# Patient Record
Sex: Female | Born: 1965
Health system: Southern US, Community
[De-identification: ages and names within clinical notes are randomized; demographics above are authoritative.]

## PROBLEM LIST (undated history)

## (undated) DIAGNOSIS — G43909 Migraine, unspecified, not intractable, without status migrainosus: Secondary | ICD-10-CM

## (undated) DIAGNOSIS — I10 Essential (primary) hypertension: Secondary | ICD-10-CM

## (undated) DIAGNOSIS — B399 Histoplasmosis, unspecified: Secondary | ICD-10-CM

## (undated) DIAGNOSIS — K589 Irritable bowel syndrome without diarrhea: Secondary | ICD-10-CM

## (undated) DIAGNOSIS — I319 Disease of pericardium, unspecified: Secondary | ICD-10-CM

## (undated) DIAGNOSIS — E7800 Pure hypercholesterolemia, unspecified: Secondary | ICD-10-CM

## (undated) HISTORY — PX: CHOLECYSTECTOMY: SHX55

## (undated) HISTORY — DX: Pure hypercholesterolemia, unspecified: E78.00

## (undated) HISTORY — DX: Irritable bowel syndrome, unspecified: K58.9

## (undated) HISTORY — PX: WRIST SURGERY: SHX841

## (undated) HISTORY — PX: THORACOTOMY: SUR1349

---

## 1996-04-07 HISTORY — PX: THORACOTOMY: SUR1349

## 2015-09-21 ENCOUNTER — Emergency Department: Payer: BLUE CROSS/BLUE SHIELD

## 2015-09-21 ENCOUNTER — Encounter: Payer: Self-pay | Admitting: Emergency Medicine

## 2015-09-21 ENCOUNTER — Emergency Department
Admission: EM | Admit: 2015-09-21 | Discharge: 2015-09-21 | Disposition: A | Discharge status: Home / Self Care | Payer: BLUE CROSS/BLUE SHIELD | Attending: Emergency Medicine | Admitting: Emergency Medicine

## 2015-09-21 DIAGNOSIS — G542 Cervical root disorders, not elsewhere classified: Secondary | ICD-10-CM | POA: Diagnosis not present

## 2015-09-21 DIAGNOSIS — R51 Headache: Secondary | ICD-10-CM | POA: Diagnosis present

## 2015-09-21 HISTORY — DX: Histoplasmosis, unspecified: B39.9

## 2015-09-21 HISTORY — DX: Disease of pericardium, unspecified: I31.9

## 2015-09-21 HISTORY — DX: Migraine, unspecified, not intractable, without status migrainosus: G43.909

## 2015-09-21 LAB — BASIC METABOLIC PANEL
ANION GAP: 7 (ref 5–15)
BUN: 13 mg/dL (ref 6–20)
CALCIUM: 8.8 mg/dL — AB (ref 8.9–10.3)
CHLORIDE: 106 mmol/L (ref 101–111)
CO2: 27 mmol/L (ref 22–32)
Creatinine, Ser: 0.88 mg/dL (ref 0.44–1.00)
GFR calc non Af Amer: >60 mL/min (ref >60)
GLUCOSE: 91 mg/dL (ref 65–99)
POTASSIUM: 3.9 mmol/L (ref 3.5–5.1)
Sodium: 140 mmol/L (ref 135–145)

## 2015-09-21 LAB — CBC WITH DIFFERENTIAL/PLATELET
BASOS PCT: 1 %
Basophils Absolute: 0.1 10*3/uL (ref 0–0.1)
Eosinophils Absolute: 0.2 10*3/uL (ref 0–0.7)
Eosinophils Relative: 3 %
HEMATOCRIT: 39.3 % (ref 35.0–47.0)
HEMOGLOBIN: 13.6 g/dL (ref 12.0–16.0)
LYMPHS PCT: 23 %
Lymphs Abs: 1.8 10*3/uL (ref 1.0–3.6)
MCH: 28.5 pg (ref 26.0–34.0)
MCHC: 34.5 g/dL (ref 32.0–36.0)
MCV: 82.6 fL (ref 80.0–100.0)
MONO ABS: 0.6 10*3/uL (ref 0.2–0.9)
MONOS PCT: 7 %
NEUTROS ABS: 5 10*3/uL (ref 1.4–6.5)
NEUTROS PCT: 66 %
Platelets: 316 10*3/uL (ref 150–440)
RBC: 4.76 MIL/uL (ref 3.80–5.20)
RDW: 13.2 % (ref 11.5–14.5)
WBC: 7.7 10*3/uL (ref 3.6–11.0)

## 2015-09-21 MED ORDER — HYDROCODONE-ACETAMINOPHEN 5-325 MG PO TABS
1.0000 | ORAL_TABLET | Freq: Once | ORAL | Status: AC
  Administered 2015-09-21: 1 via ORAL
  Filled 2015-09-21: qty 1

## 2015-09-21 MED ORDER — IOPAMIDOL (ISOVUE-370) INJECTION 76%
75.0000 mL | Freq: Once | INTRAVENOUS | Status: AC | PRN
  Administered 2015-09-21: 75 mL via INTRAVENOUS

## 2015-09-21 MED ORDER — HYDROCODONE-ACETAMINOPHEN 5-325 MG PO TABS: 1.0000 | ORAL_TABLET | Freq: Four times a day (QID) | ORAL | Status: DC | PRN

## 2015-09-21 NOTE — Discharge Instructions (Signed)
Cervical Radiculopathy Cervical radiculopathy happens when a nerve in the neck (cervical nerve) is pinched or bruised. This condition can develop because of an injury or as part of the normal aging process. Pressure on the cervical nerves can cause pain or numbness that runs from the neck all the way down into the arm and fingers. Usually, this condition gets better with rest. Treatment may be needed if the condition does not improve.  CAUSES This condition may be caused by:  Injury.  Slipped (herniated) disk.  Muscle tightness in the neck because of overuse.  Arthritis.  Breakdown or degeneration in the bones and joints of the spine (spondylosis) due to aging.  Bone spurs that may develop near the cervical nerves. SYMPTOMS Symptoms of this condition include:  Pain that runs from the neck to the arm and hand. The pain can be severe or irritating. It may be worse when the neck is moved.  Numbness or weakness in the affected arm and hand. DIAGNOSIS This condition may be diagnosed based on symptoms, medical history, and a physical exam. You may also have tests, including:  X-rays.  CT scan.  MRI.  Electromyogram (EMG).  Nerve conduction tests. TREATMENT In many cases, treatment is not needed for this condition. With rest, the condition usually gets better over time. If treatment is needed, options may include:  Wearing a soft neck collar for short periods of time.  Physical therapy to strengthen your neck muscles.  Medicines, such as NSAIDs, oral corticosteroids, or spinal injections.  Surgery. This may be needed if other treatments do not help. Various types of surgery may be done depending on the cause of your problems. HOME CARE INSTRUCTIONS Managing Pain  Take over-the-counter and prescription medicines only as told by your health care provider.  If directed, apply ice to the affected area.  Put ice in a plastic bag.  Place a towel between your skin and the  bag.  Leave the ice on for 20 minutes, 2-3 times per day.  If ice does not help, you can try using heat. Take a warm shower or warm bath, or use a heat pack as told by your health care provider.  Try a gentle neck and shoulder massage to help relieve symptoms. Activity  Rest as needed. Follow instructions from your health care provider about any restrictions on activities.  Do stretching and strengthening exercises as told by your health care provider or physical therapist. General Instructions  If you were given a soft collar, wear it as told by your health care provider.  Use a flat pillow when you sleep.  Keep all follow-up visits as told by your health care provider. This is important. SEEK MEDICAL CARE IF:  Your condition does not improve with treatment. SEEK IMMEDIATE MEDICAL CARE IF:  Your pain gets much worse and cannot be controlled with medicines.  You have weakness or numbness in your hand, arm, face, or leg.  You have a high fever.  You have a stiff, rigid neck.  You lose control of your bowels or your bladder (have incontinence).  You have trouble with walking, balance, or speaking.   This information is not intended to replace advice given to you by your health care provider. Make sure you discuss any questions you have with your health care provider.   Document Released: 12/17/2000 Document Revised: 12/13/2014 Document Reviewed: 05/18/2014 Elsevier Interactive Patient Education [employer].  Please return immediately if condition worsens. Please contact her primary physician or the  physician you were given for referral. If you have any specialist physicians involved in her treatment and plan please also contact them. Thank you for using [facility]. Please return especially for chest pain, shortness of breath, weakness, fever, or any other new concerns. Please take ibuprofen in over-the-counter and follow up with a primary  physician.

## 2015-09-21 NOTE — ED Notes (Addendum)
BP LEFT: 129/86   RIGHT: 118/76

## 2015-09-21 NOTE — ED Notes (Signed)
Pt reports ha for several days.  States that is not uncommon for her but yesterday started having cold feeling in left arm.  Grips equal.  Left arm slightly cooler than left. MAE, PERRL.

## 2015-09-21 NOTE — ED Provider Notes (Signed)
Time Seen: Approximately 1008  I have reviewed the triage notes  Chief Complaint: Arm Pain and Headache   History of Present Illness: Natasha Cook is a 49 y.o. female who states that she's noticed over the last several days a headache gets global in nature but focus mainly on the posterior part in the cerebellar region and along with some left-sided neck discomfort. She states she also has some pain that radiates down into her left trapezius area. She's not noticed any arm pain on the left side but she has noticed a coolness. She denies any weakness in the left arm. She denies any chest pain or shortness of breath. She denies any positional or reproducible components. She states that she's had it for approximately 2 weeks and it seemed to get worse over the weekend.   Past Medical History  Diagnosis Date  . Migraine   . Pericarditis   . Histoplasmosis     There are no active problems to display for this patient.   Past Surgical History  Procedure Laterality Date  . Cholecystectomy    . Thoracotomy    . Wrist surgery      Past Surgical History  Procedure Laterality Date  . Cholecystectomy    . Thoracotomy    . Wrist surgery      Current Outpatient Rx  Name  Route  Sig  Dispense  Refill  . ibuprofen (ADVIL,MOTRIN) 200 MG tablet   Oral   Take 800 mg by mouth every 8 (eight) hours as needed for headache, mild pain or moderate pain.         . SUMAtriptan (IMITREX) 100 MG tablet   Oral   Take 100 mg by mouth every 2 (two) hours as needed for migraine. May repeat in 2 hours if headache persists or recurs.         Marland Kitchen HYDROcodone-acetaminophen (NORCO) 5-325 MG tablet   Oral   Take 1 tablet by mouth every 6 (six) hours as needed for moderate pain.   20 tablet   0     Allergies:  Codeine and Penicillins  Family History: History reviewed. No pertinent family history.  Social History: Social History  Substance Use Topics  . Smoking status: Never Smoker   .  Smokeless tobacco: Never Used  . Alcohol Use: Yes     Review of Systems:   10 point review of systems was performed and was otherwise negative:  Constitutional: No fever Eyes: No visual disturbances ENT: No sore throat, ear pain Cardiac: No chest pain Respiratory: No shortness of breath, wheezing, or stridor Abdomen: No abdominal pain, no vomiting, No diarrhea Endocrine: No weight loss, No night sweats Extremities: No peripheral edema, cyanosis Skin: No rashes, easy bruising Neurologic: No focal weakness, trouble with speech or swollowing Urologic: No dysuria, Hematuria, or urinary frequency  Physical Exam:  ED Triage Vitals  Enc Vitals Group     BP 09/21/15 1000 122/74 mmHg     Pulse Rate 09/21/15 1000 68     Resp 09/21/15 1000 16     Temp 09/21/15 1002 98.6 F (37 C)     Temp Source 09/21/15 1002 Oral     SpO2 09/21/15 1000 100 %     Weight 09/21/15 1001 190 lb (86.183 kg)     Height 09/21/15 1001 5\' 3"  (1.6 m)     Head Cir --      Peak Flow --      Pain Score 09/21/15 0957 5  Pain Loc --      Pain Edu? --      Excl. in [place]? --     General: Awake , Alert , and Oriented times 3; GCS 15 Head: Normal cephalic , atraumatic Eyes: Pupils equal , round, reactive to light Nose/Throat: No nasal drainage, patent upper airway without erythema or exudate.  Neck: Supple, Full range of motion, No anterior adenopathy or palpable thyroid masses Lungs: Clear to ascultation without wheezes , rhonchi, or rales Heart: Regular rate, regular rhythm without murmurs , gallops , or rubs Abdomen: Soft, non tender without rebound, guarding , or rigidity; bowel sounds positive and symmetric in all 4 quadrants. No organomegaly .        Extremities: 2 plus symmetric pulses.Pulses are symmetric over the radial artery region. No edema, clubbing or cyanosis Neurologic: normal ambulation, Motor symmetric in both upper extremities with no noticeable deficits. Close testing over the radial,  medial, and all motor nerves distributions shows no deficits. Skin: warm, dry, no rashes   Labs:   All laboratory work was reviewed including any pertinent negatives or positives listed below:  [place] - Abnormal; Notable for the following:    Calcium 8.8 (*)    All other components within normal limits  CBC WITH DIFFERENTIAL/PLATELET    EKG:  ED ECG REPORT I, Daymon Larsen, the attending physician, personally viewed and interpreted this ECG.  Date: 09/21/2015 EKG Time: 1001 Rate: 71 Rhythm: normal sinus rhythm QRS Axis: normal Intervals: normal ST/T Wave abnormalities: normal Conduction Disturbances: none Narrative Interpretation: unremarkable    Radiology:      CT Angio Neck W/Cm &/Or Wo/Cm (Final result) Result time: 09/21/15 14:15:22   Final result by Rad Results In Interface (09/21/15 14:15:22)   Narrative:   CLINICAL DATA: Left neck pain. Rule out carotid dissection.  EXAM: CT ANGIOGRAPHY NECK  TECHNIQUE: Multidetector CT imaging of the neck was performed using the standard protocol during bolus administration of intravenous contrast. Multiplanar CT image reconstructions and MIPs were obtained to evaluate the vascular anatomy. Carotid stenosis measurements (when applicable) are obtained utilizing NASCET criteria, using the distal internal carotid diameter as the denominator.  CONTRAST: 75 mL Isovue 370 IV  COMPARISON: CT head 09/21/2015  FINDINGS: Aortic arch: Normal aortic arch without dissection or aneurysm. Proximal great vessels widely patent.  Right carotid system: Normal right carotid. No atherosclerotic disease or dissection or aneurysm.  Left carotid system: Normal left carotid. Negative for dissection or aneurysm. No significant stenosis.  Vertebral arteries:Both vertebral arteries are widely patent without stenosis or dissection. No aneurysm.  Skeleton: Disc degeneration and spondylosis at C4-5.  Mild spinal stenosis. Mild left foraminal narrowing due to spurring. Negative for fracture or mass lesion.  Other neck: Lung apices clear.  IMPRESSION: Negative for carotid or vertebral artery dissection. No significant arterial stenosis in the neck  Disc degeneration and spondylosis at C4-5 with mild spinal stenosis.   Electronically Signed By: Franchot Gallo M.D. On: 09/21/2015 14:15          DG Chest 2 View (Final result) Result time: 09/21/15 10:53:30   Final result by Rad Results In Interface (09/21/15 10:53:30)   Narrative:   CLINICAL DATA: Fever  EXAM: CHEST 2 VIEW  COMPARISON: None.  FINDINGS: Lungs are clear. Heart size and pulmonary vascularity are normal. No adenopathy. There is lower thoracic levoscoliosis.  IMPRESSION: No edema or consolidation.   Electronically Signed By: Lowella Grip III M.D. On: 09/21/2015 10:53  CT HEAD WO CONTRAST (Final result) Result time: 09/21/15 10:45:30   Final result by Rad Results In Interface (09/21/15 10:45:30)   Narrative:   CLINICAL DATA: Headache for 2 weeks which has worsened over the past week. History of migraines. No known injury.  EXAM: CT HEAD WITHOUT CONTRAST  TECHNIQUE: Contiguous axial images were obtained from the base of the skull through the vertex without intravenous contrast.  COMPARISON: None.  FINDINGS: The brain appears normal without hemorrhage, infarct, mass lesion, mass effect, midline shift or abnormal extra-axial fluid collection. No hydrocephalus or pneumocephalus. The calvarium is intact. Imaged paranasal sinuses and mastoid air cells are clear.  IMPRESSION: Negative head CT.   Electronically Signed By: Inge Rise M.D. On: 09/21/2015 10:45           I personally reviewed the radiologic studies     ED Course: * Patient's stay here was uneventful and the patient appears to have some form of neurologic impingement  mainly at the base at C4-C5. Patient has no neurologic deficits and was worked up primarily for carotid versus vertebral artery dissection, significant cervical disc disease, etc. The patient is otherwise stable I felt this was unlikely to be aortic dissection or coronary artery disease.    Assessment: * Cervical nerve radiculopathy   Final Clinical Impression:  Final diagnoses:  Cervical nerve root impingement     Plan: * Outpatient management Patient was prescribed Norco Patient was advised to return immediately if condition worsens. Patient was advised to follow up with their primary care physician or other specialized physicians involved in their outpatient care. The patient and/or family member/power of attorney had laboratory results reviewed at the bedside. All questions and concerns were addressed and appropriate discharge instructions were distributed by the nursing staff.            Daymon Larsen, MD 09/21/15 [phone]

## 2015-09-21 NOTE — ED Notes (Signed)
Pt states she has a hx of migraine headaches and had a migraine for 2 weeks and has worsened since the weekend. Pt states intensity is same as previous migraines but reports a different head pain. Pt usually has neck pain and eye symptoms. Pt also c/o left arm pain on Tuesday and is cold and clammy. Pt is able to move arms. Pt also states she is seeing stars a lot that started to increase in frequency this week.

## 2015-09-21 NOTE — ED Notes (Signed)
Dr. Marcelene Butte at bs

## 2015-10-23 ENCOUNTER — Ambulatory Visit (INDEPENDENT_AMBULATORY_CARE_PROVIDER_SITE_OTHER): Payer: BLUE CROSS/BLUE SHIELD | Admitting: Neurology

## 2015-10-23 ENCOUNTER — Encounter: Payer: Self-pay | Admitting: Neurology

## 2015-10-23 VITALS — BP 124/68 | HR 74 | Ht 64.0 in | Wt 191.0 lb

## 2015-10-23 DIAGNOSIS — G43709 Chronic migraine without aura, not intractable, without status migrainosus: Secondary | ICD-10-CM | POA: Diagnosis not present

## 2015-10-23 DIAGNOSIS — N943 Premenstrual tension syndrome: Secondary | ICD-10-CM

## 2015-10-23 DIAGNOSIS — G43829 Menstrual migraine, not intractable, without status migrainosus: Secondary | ICD-10-CM

## 2015-10-23 DIAGNOSIS — G478 Other sleep disorders: Secondary | ICD-10-CM

## 2015-10-23 DIAGNOSIS — M542 Cervicalgia: Secondary | ICD-10-CM | POA: Diagnosis not present

## 2015-10-23 MED ORDER — PROMETHAZINE HCL 12.5 MG PO TABS: 12.5000 mg | ORAL_TABLET | Freq: Four times a day (QID) | ORAL | Status: DC | PRN

## 2015-10-23 MED ORDER — NAPROXEN 500 MG PO TABS: ORAL_TABLET | ORAL | Status: DC

## 2015-10-23 MED ORDER — NORTRIPTYLINE HCL 25 MG PO CAPS
25.0000 mg | ORAL_CAPSULE | Freq: Every day | ORAL | Status: DC
Start: 2015-10-23 — End: 2016-01-29

## 2015-10-23 MED ORDER — SUMATRIPTAN SUCCINATE 100 MG PO TABS
ORAL_TABLET | ORAL | Status: DC
Start: 2015-10-23 — End: 2016-01-29

## 2015-10-23 NOTE — Progress Notes (Signed)
Sleep hygiene information mailed to patient.

## 2015-10-23 NOTE — Patient Instructions (Signed)
Migraine Recommendations: 1.  Start nortriptyline 25mg  at bedtime.  Call in 4 weeks with update and we can adjust dose if needed. 2.  Take sumatriptan 100mg  with naproxen 500mg  at earliest onset of headache.  May repeat dose once in 2 hours if needed.  Do not exceed two doses in 24 hours. 3.  Limit use of pain relievers to no more than 2 days out of the week.  These medications include acetaminophen, ibuprofen, triptans and narcotics.  This will help reduce risk of rebound headaches. 4.  Be aware of common food triggers such as processed sweets, processed foods with nitrites (such as deli meat, hot dogs, sausages), foods with MSG, alcohol (such as wine), chocolate, certain cheeses, certain fruits (dried fruits, some citrus fruit), vinegar, diet soda. 4.  Avoid caffeine 5.  Routine exercise 6.  Proper sleep hygiene 7.  Stay adequately hydrated with water 8.  Keep a headache diary. 9.  Maintain proper stress management. 10.  Do not skip meals. 11.  Consider supplements:  Magnesium oxide 400mg  to 600mg  daily, riboflavin 400mg , Coenzyme Q 10 100mg  three times daily 12.  Will refer you to PT for neck pain 13.  Will refer you to sleep medicine for evaluation of sleep apnea 14.  Follow up in 3 months.  Migraine Headache A migraine headache is an intense, throbbing pain on one or both sides of your head. A migraine can last for 30 minutes to several hours. CAUSES  The exact cause of a migraine headache is not always known. However, a migraine may be caused when nerves in the brain become irritated and release chemicals that cause inflammation. This causes pain. Certain things may also trigger migraines, such as:  Alcohol.  Smoking.  Stress.  Menstruation.  Aged cheeses.  Foods or drinks that contain nitrates, glutamate, aspartame, or tyramine.  Lack of sleep.  Chocolate.  Caffeine.  Hunger.  Physical exertion.  Fatigue.  Medicines used to treat chest pain (nitroglycerine),  birth control pills, estrogen, and some blood pressure medicines. SIGNS AND SYMPTOMS  Pain on one or both sides of your head.  Pulsating or throbbing pain.  Severe pain that prevents daily activities.  Pain that is aggravated by any physical activity.  Nausea, vomiting, or both.  Dizziness.  Pain with exposure to bright lights, loud noises, or activity.  General sensitivity to bright lights, loud noises, or smells. Before you get a migraine, you may get warning signs that a migraine is coming (aura). An aura may include:  Seeing flashing lights.  Seeing bright spots, halos, or zigzag lines.  Having tunnel vision or blurred vision.  Having feelings of numbness or tingling.  Having trouble talking.  Having muscle weakness. DIAGNOSIS  A migraine headache is often diagnosed based on:  Symptoms.  Physical exam.  A CT scan or MRI of your head. These imaging tests cannot diagnose migraines, but they can help rule out other causes of headaches. TREATMENT Medicines may be given for pain and nausea. Medicines can also be given to help prevent recurrent migraines.  HOME CARE INSTRUCTIONS  Only take over-the-counter or prescription medicines for pain or discomfort as directed by your health care provider. The use of long-term narcotics is not recommended.  Lie down in a dark, quiet room when you have a migraine.  Keep a journal to find out what may trigger your migraine headaches. For example, write down:  What you eat and drink.  How much sleep you get.  Any change to your  diet or medicines.  Limit alcohol consumption.  Quit smoking if you smoke.  Get 7-9 hours of sleep, or as recommended by your health care provider.  Limit stress.  Keep lights dim if bright lights bother you and make your migraines worse. SEEK IMMEDIATE MEDICAL CARE IF:   Your migraine becomes severe.  You have a fever.  You have a stiff neck.  You have vision loss.  You have muscular  weakness or loss of muscle control.  You start losing your balance or have trouble walking.  You feel faint or pass out.  You have severe symptoms that are different from your first symptoms. MAKE SURE YOU:   Understand these instructions.  Will watch your condition.  Will get help right away if you are not doing well or get worse.   This information is not intended to replace advice given to you by your health care provider. Make sure you discuss any questions you have with your health care provider.   Document Released: 03/24/2005 Document Revised: 04/14/2014 Document Reviewed: 11/29/2012 Elsevier Interactive Patient Education [employer].

## 2015-10-23 NOTE — Progress Notes (Signed)
Chart forwarded.  

## 2015-10-23 NOTE — Progress Notes (Signed)
NEUROLOGY CONSULTATION NOTE  Natasha Cook MRN: [id number] DOB: [birth year]  Referring provider: ED referral  Primary care provider: Einar Pheasant  Reason for consult:  migraine  HISTORY OF PRESENT ILLNESS: Natasha Cook is a 50 year old left-handed woman with histoplasmosis, migraine and history of pericarditis who presents for migraine.  History obtained by patient and ED note.  Onset:  childhood Location:  Usually right sided, from back of head to behind right eye (sometimes left-sided) Quality:  Throbbing headache, stabbing eye pain Intensity:  8-9/10 Aura:  Blurred vision in right eye Prodrome:  Hunger for caffeine and sugar Associated symptoms:  Nausea, vomiting, photophobia, phonophobia, osmophobia.  No autonomic symptoms. Duration:  4 hours (if sumatriptan works) up to 1 day.  It lasts 2 to 3 days around her period Frequency:  Just prior and after her period.  Otherwise, every other week (total of 7 migraine days per month, but has daily dull headache) Triggers/exacerbating factors:  Fluorescent light, red wine, scents (perfume), glare Relieving factors:  no Activity:  aggravates  Past NSAIDS:  ibuprofen Past analgesics:  Excedrin, acetaminophen/caffeine/dihydrocodone Past abortive triptans:  Sumatriptan NS (nausea) and injection (effective but inconvenient) Past muscle relaxants:  no Past anti-emetic:  no Past anti-anxiolytic:  no Past antihypertensive medications:  no Past antidepressant medications:  no Past anticonvulsant medications:  topiramate 100mg  (increased dose caused cognitive deficits and blurred vision) Past vitamins/Herbal/Supplements:  no Past antihistamines/decongestants:  no Other past medications:  no  Current NSAIDS: ibuprofen Current analgesics:  no Current triptans:  sumatriptan 100mg  Current anti-emetic:  no Current muscle relaxants:  no Current anti-anxiolytic:  no Current sleep aide:  no Current Antihypertensive medications:   no Current Antidepressant medications:  no Current Anticonvulsant medications:  no Current Vitamins/Herbal/Supplements:  no Current Antihistamines/Decongestants:  no Other therapy:  Dry needling  Caffeine:  1 cup coffee daily, 1 diet Coke every other day Alcohol:  occasional Smoker:  no Diet:  hydrates Exercise:  30 minute walk daily Depression/stress:  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  Snores.  Possibly with apneic episodes Family history of headache:  yes  She presented to the ED on 09/06/14/17 with pain and ice cold sensation of entire left arm with pain in the neck.  CBC and BMP were unremarkable.  CT of head was personally reviewed and was unremarkable.  CTA of neck was personally reviewed and was negative for carotid or vertebral artery dissection.  It also revealed disc degeneration and spondylosis at C4-5 with mild spinal stenosis.  Over the past 2 years, she has noticed problems with balance.  She works in a physical therapy class.  When she stands on one foot, she feels off balance.  If she is in the shower and closes her eyes and turns her head, she needs to hold on.  She denies dizziness but notes fullness in ears.  She denies dizziness.  PAST MEDICAL HISTORY: Past Medical History  Diagnosis Date  . Migraine   . Pericarditis   . Histoplasmosis     PAST SURGICAL HISTORY: Past Surgical History  Procedure Laterality Date  . Cholecystectomy    . Thoracotomy    . Wrist surgery      MEDICATIONS: Current Outpatient Prescriptions on File Prior to Visit  Medication Sig Dispense Refill  . HYDROcodone-acetaminophen (NORCO) 5-325 MG tablet Take 1 tablet by mouth every 6 (six) hours as needed for moderate pain. 20 tablet 0  . ibuprofen (ADVIL,MOTRIN) 200 MG tablet  Take 800 mg by mouth every 8 (eight) hours as needed for headache, mild pain or moderate pain.     No current facility-administered medications on file prior to visit.     ALLERGIES: Allergies  Allergen Reactions  . Codeine Rash  . Penicillins Rash    FAMILY HISTORY: No history of stroke, dementia.  Positive for migraines.  SOCIAL HISTORY: Social History   Social History  . Marital Status: Married    Spouse Name: N/A  . Number of Children: N/A  . Years of Education: N/A   Occupational History  . Not on file.   Social History Main Topics  . Smoking status: Never Smoker   . Smokeless tobacco: Never Used  . Alcohol Use: Yes  . Drug Use: Not on file  . Sexual Activity: Not on file   Other Topics Concern  . Not on file   Social History Narrative    REVIEW OF SYSTEMS: Constitutional: No fevers, chills, or sweats, no generalized fatigue, change in appetite Eyes: No visual changes, double vision, eye pain Ear, nose and throat: No hearing loss, ear pain, nasal congestion, sore throat Cardiovascular: No chest pain, palpitations Respiratory:  No shortness of breath at rest or with exertion, wheezes GastrointestinaI: No nausea, vomiting, diarrhea, abdominal pain, fecal incontinence Genitourinary:  No dysuria, urinary retention or frequency Musculoskeletal:  No neck pain, back pain Integumentary: No rash, pruritus, skin lesions Neurological: as above Psychiatric: No depression, insomnia, anxiety Endocrine: No palpitations, fatigue, diaphoresis, mood swings, change in appetite, change in weight, increased thirst Hematologic/Lymphatic:  No purpura, petechiae. Allergic/Immunologic: no itchy/runny eyes, nasal congestion, recent allergic reactions, rashes  PHYSICAL EXAM: Filed Vitals:   10/23/15 0940  BP: 124/68  Pulse: 74   General: No acute distress.  Patient appears well-groomed.  Head:  Normocephalic/atraumatic Eyes:  fundi examined but not visualized Neck: supple, no paraspinal tenderness, full range of motion Back: No paraspinal tenderness Heart: regular rate and rhythm Lungs: Clear to auscultation bilaterally. Vascular: No  carotid bruits. Neurological Exam: Mental status: alert and oriented to person, place, and time, recent and remote memory intact, fund of knowledge intact, attention and concentration intact, speech fluent and not dysarthric, language intact. Cranial nerves: CN I: not tested CN II: pupils equal, round and reactive to light, visual fields intact CN III, IV, VI:  full range of motion, no nystagmus, no ptosis CN V: facial sensation intact CN VII: upper and lower face symmetric CN VIII: hearing intact CN IX, X: gag intact, uvula midline CN XI: sternocleidomastoid and trapezius muscles intact CN XII: tongue midline Bulk & Tone: normal, no fasciculations. Motor:  5/5 throughout  Sensation:  Pinprick and vibration sensation intact. Deep Tendon Reflexes:  2+ throughout, toes downgoing.  Finger to nose testing:  Without dysmetria.  Heel to shin:  Without dysmetria.  Gait:  Normal station and stride.  Able to turn and tandem walk. Romberg negative.  IMPRESSION: Chronic migraine/menstrually-related migraine Neck pain with degenerative disc  Loss of balance.  No abnormalities appreciated on exam, such as neuropathy, vestibulopathy, myelopathy.  CT of head is unremarkable. Poor sleep hygiene, possible apneic episodes  PLAN: 1.  Nortriptyline 25mg  at bedtime for prevenative 2.  Sumatriptan 100mg  with naproxen 500mg  for abortive therapy.  Promethazine 12.5mg  for nausea. 3.  Refer to PT for neck pain 4.  Refer to sleep medicine for evaluation of OSA 5.  Follow up in 3 months.  Thank you for allowing me to take part in the care of this patient.  Metta Clines, DO  CC:  Einar Pheasant

## 2015-11-23 ENCOUNTER — Ambulatory Visit: Payer: BLUE CROSS/BLUE SHIELD | Admitting: Neurology

## 2015-11-27 ENCOUNTER — Ambulatory Visit (INDEPENDENT_AMBULATORY_CARE_PROVIDER_SITE_OTHER): Payer: BLUE CROSS/BLUE SHIELD | Admitting: Neurology

## 2015-11-27 ENCOUNTER — Encounter: Payer: Self-pay | Admitting: Neurology

## 2015-11-27 VITALS — BP 116/86 | HR 72 | Resp 16 | Ht 64.0 in | Wt 192.0 lb

## 2015-11-27 DIAGNOSIS — G4710 Hypersomnia, unspecified: Secondary | ICD-10-CM

## 2015-11-27 DIAGNOSIS — R0681 Apnea, not elsewhere classified: Secondary | ICD-10-CM | POA: Diagnosis not present

## 2015-11-27 DIAGNOSIS — R0683 Snoring: Secondary | ICD-10-CM | POA: Diagnosis not present

## 2015-11-27 DIAGNOSIS — R519 Headache, unspecified: Secondary | ICD-10-CM

## 2015-11-27 DIAGNOSIS — R51 Headache: Secondary | ICD-10-CM

## 2015-11-27 NOTE — Progress Notes (Signed)
Subjective:    Patient ID: Natasha Cook is a 50 y.o. female.  HPI     Star Age, MD, PhD [facility] [address].O. [place], [place] [address]  Dear Quita Skye,   I saw your patient, Natasha Cook, upon your kind request in my clinic today for initial consultation of her sleep disorder, in particular, concern for underlying obstructive sleep apnea. The patient is unaccompanied today. As you know, Natasha Cook is a 50 year old right-handed woman with an underlying medical history of histoplasmosis, pericarditis, migraine headaches and obesity, who reports snoring and excessive daytime somnolence, morning HAs and nocturia. I reviewed your office note from 10/23/2015. Her ESS is 7/24 today, fatigue score is 27/63. She had a sleep study years ago, she estimates sometime in the 2000s but results are not available for my review. She recalls that she was told she had some apneas, but no significant oxygen drops, not enough to be treated with CPAP at the time. She was told to try to lose weight. She has been struggling with her weight for quite some time. She was treated for 3-1/2 years with steroids due to her histoplasmosis and gained a lot of weight and since then has had difficulty losing weight. She does not recall any family history of OSA but suspects that her father may have had it. She reports difficulty staying asleep, morning headaches, apneic pauses while asleep. She lives with her spouse, she has no children, she works at [employer]. She is a nonsmoker, drinks alcohol occasionally, drinks caffeine in the form of coffee, 1-2 cups daily. She started nortriptyline about a month ago and she feels, it has helped her headaches and has helped her sleep.  She has two dogs in bed with them. TV is on at night in the bedroom, husband turns it off.  She denies RLS symptoms and is not known to kick her sleep. Since starting the Pamelor, she has had vivid  dreams.  She goes to bed around 10:30 to 11 PM and takes at least 30 minutes to fall asleep. Wake time is 6:15 AM and she feels fairly adequately rested. She likes to nap on WEs.  Her Past Medical History Is Significant For: Past Medical History:  Diagnosis Date  . Histoplasmosis   . Migraine   . Pericarditis     Her Past Surgical History Is Significant For: Past Surgical History:  Procedure Laterality Date  . CHOLECYSTECTOMY    . THORACOTOMY    . WRIST SURGERY      Her Family History Is Significant For: Family History  Problem Relation Age of Onset  . Skin cancer Mother   . Migraines Mother   . Tongue cancer Father   . Stroke Father   . Cancer Maternal Grandmother   . Cancer Maternal Grandfather   . Stroke Paternal Grandmother     Her Social History Is Significant For: Social History   Social History  . Marital status: Married    Spouse name: N/A  . Number of children: 0  . Years of education: PhD   Social History Main Topics  . Smoking status: Never Smoker  . Smokeless tobacco: Never Used  . Alcohol use Yes  . Drug use: No  . Sexual activity: Not Asked   Other Topics Concern  . None   Social History Narrative   Drinks 1-2 cups of coffee a day.     Her Allergies Are:  Allergies  Allergen Reactions  .  Codeine Rash  . Penicillins Rash  :   Her Current Medications Are:  Outpatient Encounter Prescriptions as of 11/27/2015  Medication Sig  . magnesium oxide (MAG-OX) 400 MG tablet Take 400 mg by mouth daily.  . Multiple Vitamin (MULTIVITAMIN) capsule Take 1 capsule by mouth daily.  . naproxen (NAPROSYN) 500 MG tablet Take 1 tablet with each dose of sumatriptan  . nortriptyline (PAMELOR) 25 MG capsule Take 1 capsule (25 mg total) by mouth at bedtime.  . SUMAtriptan (IMITREX) 100 MG tablet Take 1 tab at earliest onset of headache.  May repeat once in 2 hours if headache persists or recurs.  Marland Kitchen HYDROcodone-acetaminophen (NORCO) 5-325 MG tablet Take 1 tablet  by mouth every 6 (six) hours as needed for moderate pain. (Patient not taking: Reported on 11/27/2015)  . [DISCONTINUED] ibuprofen (ADVIL,MOTRIN) 200 MG tablet Take 800 mg by mouth every 8 (eight) hours as needed for headache, mild pain or moderate pain.  . [DISCONTINUED] promethazine (PHENERGAN) 12.5 MG tablet Take 1 tablet (12.5 mg total) by mouth every 6 (six) hours as needed for nausea or vomiting.   No facility-administered encounter medications on file as of 11/27/2015.   :  Review of Systems:  Out of a complete 14 point review of systems, all are reviewed and negative with the exception of these symptoms as listed below: Review of Systems  Constitutional: Positive for fatigue.  Neurological:       Patient states that she had sleep study in early 2000's. She was told that she had apneic events but oxygen did not drop low enough to start CPAP.  Patient has trouble falling and staying asleep, snoring, witnessed apnea, wakes up feeling tired, morning headaches, daytime tiredness, denies taking naps.    Epworth Sleepiness Scale 0= would never doze 1= slight chance of dozing 2= moderate chance of dozing 3= high chance of dozing  Sitting and reading:1 Watching TV:2 Sitting inactive in a public place (ex. Theater or meeting):0 As a passenger in a car for an hour without a break:1 Lying down to rest in the afternoon:3 Sitting and talking to someone:0 Sitting quietly after lunch (no alcohol):0 In a car, while stopped in traffic:0 Total:7  Objective:  Neurologic Exam  Physical Exam Physical Examination:   Vitals:   11/27/15 0853  BP: 116/86  Pulse: 72  Resp: 16   General Examination: The patient is a very pleasant 50 y.o. female in no acute distress. She appears well-developed and well-nourished and well groomed.   HEENT: Normocephalic, atraumatic, pupils are equal, round and reactive to light and accommodation. Funduscopic exam is normal with sharp disc margins noted.  Extraocular tracking is good without limitation to gaze excursion or nystagmus noted. Normal smooth pursuit is noted. Hearing is grossly intact. Tympanic membranes are clear bilaterally. Face is symmetric with normal facial animation and normal facial sensation. Speech is clear with no dysarthria noted. There is no hypophonia. There is no lip, neck/head, jaw or voice tremor. Neck is supple with full range of passive and active motion. There are no carotid bruits on auscultation. Oropharynx exam reveals: mild mouth dryness, adequate dental hygiene and mild airway crowding, due to smaller airway entry, slight wider uvula. Mallampati is class II. Tongue protrudes centrally and palate elevates symmetrically. Tonsils are 1+ in size. Neck size is 14 1/8 inches. She has a Mild overbite. Nasal inspection reveals no significant nasal mucosal bogginess or redness and no septal deviation.   Chest: Clear to auscultation without wheezing, rhonchi or crackles  noted.  Heart: S1+S2+0, regular and normal without murmurs, rubs or gallops noted.   Abdomen: Soft, non-tender and non-distended with normal bowel sounds appreciated on auscultation.  Extremities: There is no pitting edema in the distal lower extremities bilaterally. Pedal pulses are intact.  Skin: Warm and dry without trophic changes noted. There are no varicose veins.  Musculoskeletal: exam reveals no obvious joint deformities, tenderness or joint swelling or erythema.   Neurologically:  Mental status: The patient is awake, alert and oriented in all 4 spheres. Her immediate and remote memory, attention, language skills and fund of knowledge are appropriate. There is no evidence of aphasia, agnosia, apraxia or anomia. Speech is clear with normal prosody and enunciation. Thought process is linear. Mood is normal and affect is normal.  Cranial nerves II - XII are as described above under HEENT exam. In addition: shoulder shrug is normal with equal shoulder  height noted. Motor exam: Normal bulk, strength and tone is noted. There is no drift, tremor or rebound. Romberg is negative. Reflexes are 2+ throughout. Fine motor skills and coordination: intact with normal finger taps, normal hand movements, normal rapid alternating patting, normal foot taps and normal foot agility.  Cerebellar testing: No dysmetria or intention tremor on finger to nose testing. Heel to shin is unremarkable bilaterally. There is no truncal or gait ataxia.  Sensory exam: intact to light touch, pinprick, vibration, temperature sense in the upper and lower extremities.  Gait, station and balance: She stands easily. No veering to one side is noted. No leaning to one side is noted. Posture is age-appropriate and stance is narrow based. Gait shows normal stride length and normal pace. No problems turning are noted. Tandem walk is unremarkable. Intact toe and heel stance is noted.               Assessment and Plan:  In summary, Natasha Cook is a very pleasant 50 y.o.-year old female with an underlying medical history of histoplasmosis, pericarditis, migraine headaches and obesity, whose history and physical exam are indeed concerning for obstructive sleep apnea (OSA). I had a long chat with the patient about my findings and the diagnosis of OSA, its prognosis and treatment options. We talked about medical treatments, surgical interventions and non-pharmacological approaches. I explained in particular the risks and ramifications of untreated moderate to severe OSA, especially with respect to developing cardiovascular disease down the Road, including congestive heart failure, difficult to treat hypertension, cardiac arrhythmias, or stroke. Even type 2 diabetes has, in part, been linked to untreated OSA. Symptoms of untreated OSA include daytime sleepiness, memory problems, mood irritability and mood disorder such as depression and anxiety, lack of energy, as well as recurrent headaches,  especially morning headaches. We talked about trying to maintain a healthy lifestyle in general, as well as the importance of weight control. I encouraged the patient to eat healthy, exercise daily and keep well hydrated, to keep a scheduled bedtime and wake time routine, to not skip any meals and eat healthy snacks in between meals. I advised the patient not to drive when feeling sleepy. I recommended the following at this time: sleep study with potential positive airway pressure titration. (We will score hypopneas at 3% and split the sleep study into diagnostic and treatment portion, if the estimated. 2 hour AHI is >15/h).   I explained the sleep test procedure to the patient and also outlined possible surgical and non-surgical treatment options of OSA, including the use of a custom-made dental device (which would  require a referral to a specialist dentist or oral surgeon), upper airway surgical options, such as pillar implants, radiofrequency surgery, tongue base surgery, and UPPP (which would involve a referral to an ENT surgeon). Rarely, jaw surgery such as mandibular advancement may be considered.  I also explained the CPAP treatment option to the patient, who indicated that she would be willing to try CPAP if the need arises. I explained the importance of being compliant with PAP treatment, not only for insurance purposes but primarily to improve Her symptoms, and for the patient's long term health benefit, including to reduce Her cardiovascular risks. I answered all her questions today and the patient was in agreement. I would like to see her back after the sleep study is completed and encouraged her to call with any interim questions, concerns, problems or updates.   Thank you very much for allowing me to participate in the care of this nice patient. If I can be of any further assistance to you please do not hesitate to call me at [phone].  Sincerely,   Star Age, MD, PhD

## 2015-11-27 NOTE — Patient Instructions (Signed)

## 2015-12-28 ENCOUNTER — Ambulatory Visit (INDEPENDENT_AMBULATORY_CARE_PROVIDER_SITE_OTHER): Payer: BLUE CROSS/BLUE SHIELD | Admitting: Internal Medicine

## 2015-12-28 ENCOUNTER — Encounter: Payer: Self-pay | Admitting: Internal Medicine

## 2015-12-28 VITALS — BP 104/62 | HR 84 | Temp 98.0°F | Ht 64.0 in | Wt 194.2 lb

## 2015-12-28 DIAGNOSIS — K589 Irritable bowel syndrome without diarrhea: Secondary | ICD-10-CM | POA: Diagnosis not present

## 2015-12-28 DIAGNOSIS — G43909 Migraine, unspecified, not intractable, without status migrainosus: Secondary | ICD-10-CM | POA: Diagnosis not present

## 2015-12-28 DIAGNOSIS — Z23 Encounter for immunization: Secondary | ICD-10-CM

## 2015-12-28 DIAGNOSIS — N6019 Diffuse cystic mastopathy of unspecified breast: Secondary | ICD-10-CM

## 2015-12-28 DIAGNOSIS — N3289 Other specified disorders of bladder: Secondary | ICD-10-CM

## 2015-12-28 DIAGNOSIS — E7800 Pure hypercholesterolemia, unspecified: Secondary | ICD-10-CM | POA: Diagnosis not present

## 2015-12-28 DIAGNOSIS — Z1239 Encounter for other screening for malignant neoplasm of breast: Secondary | ICD-10-CM

## 2015-12-28 NOTE — Progress Notes (Signed)
Patient ID: Aricela Torrealba, female   DOB: 05/23/1965, 50 y.o.   MRN: [id number]   Subjective:    Patient ID: Caren Macadam, female    DOB: 1965/04/12, 50 y.o.   MRN: [id number]  HPI  Patient here to establish care.  She has a history of histoplasmosis, migraines and pericarditis.  Saw Dr Tomi Likens for her headaches.  Started on nortriptyline.  Off topamax.  Headaches are much better.  This is her third year in [place].  She works as head of the physical therapy department.  Is behind on her physical exams (including mammogram and pap smears).  Had fibrocystic breast disease.  She also has IBS.  Had increased diarrhea after gallbladder out.  Was placed on wellchol.  Diarrhea more controlled.  Off wellchol now and diarrhea remains improved.  She also reports a spastic bladder.  Was previously on ditropan.  Had dizziness with this medication.  Off now.  Still having issues with her bladder.  Discussed pelvic floor exercises.  She is in agreement.  She also has had issues with neck and left shoulder pain (from neck down her arm).  Went to ER.  Had CTA as outlined.  Referred to neurology.  Neck and arm pain improved.   She has had issues with sleeping.  Saw neurology.  Planning for sleep study 01/27/16.     Past Medical History:  Diagnosis Date  . Histoplasmosis   . Hypercholesterolemia   . IBS (irritable bowel syndrome)   . Migraine   . Pericarditis    Past Surgical History:  Procedure Laterality Date  . CHOLECYSTECTOMY    . THORACOTOMY    . THORACOTOMY  1998  . WRIST SURGERY     Family History  Problem Relation Age of Onset  . Skin cancer Mother   . Migraines Mother   . Tongue cancer Father   . Stroke Father   . Cancer Maternal Grandmother   . Cancer Maternal Grandfather   . Stroke Paternal Grandmother    Social History   Social History  . Marital status: Married    Spouse name: N/A  . Number of children: 0  . Years of education: PhD   Social History Main Topics  . Smoking status:  Never Smoker  . Smokeless tobacco: Never Used  . Alcohol use Yes  . Drug use: No  . Sexual activity: Not Asked   Other Topics Concern  . None   Social History Narrative   Drinks 1-2 cups of coffee a day.     Outpatient Encounter Prescriptions as of 12/28/2015  Medication Sig  . magnesium oxide (MAG-OX) 400 MG tablet Take 400 mg by mouth daily.  . Multiple Vitamin (MULTIVITAMIN) capsule Take 1 capsule by mouth daily.  . naproxen (NAPROSYN) 500 MG tablet Take 1 tablet with each dose of sumatriptan  . nortriptyline (PAMELOR) 25 MG capsule Take 1 capsule (25 mg total) by mouth at bedtime.  . SUMAtriptan (IMITREX) 100 MG tablet Take 1 tab at earliest onset of headache.  May repeat once in 2 hours if headache persists or recurs.  . [DISCONTINUED] HYDROcodone-acetaminophen (NORCO) 5-325 MG tablet Take 1 tablet by mouth every 6 (six) hours as needed for moderate pain.   No facility-administered encounter medications on file as of 12/28/2015.     Review of Systems  Constitutional: Negative for appetite change and unexpected weight change.  HENT: Negative for congestion and sinus pressure.   Respiratory: Negative for cough, chest tightness and shortness of breath.  Cardiovascular: Negative for chest pain, palpitations and leg swelling.  Gastrointestinal: Negative for abdominal pain, diarrhea, nausea and vomiting.       Diarrhea improved.   Genitourinary: Negative for difficulty urinating and dysuria.  Musculoskeletal: Negative for joint swelling and myalgias.  Skin: Negative for color change and rash.  Neurological: Positive for headaches. Negative for dizziness and light-headedness.  Psychiatric/Behavioral: Negative for agitation and dysphoric mood.       Objective:    Physical Exam  Constitutional: She appears well-developed and well-nourished. No distress.  HENT:  Nose: Nose normal.  Mouth/Throat: Oropharynx is clear and moist.  Neck: Neck supple. No thyromegaly present.    Cardiovascular: Normal rate and regular rhythm.   Pulmonary/Chest: Breath sounds normal. No respiratory distress. She has no wheezes.  Abdominal: Soft. Bowel sounds are normal. There is no tenderness.  Musculoskeletal: She exhibits no edema or tenderness.  Lymphadenopathy:    She has no cervical adenopathy.  Skin: No rash noted. No erythema.  Psychiatric: She has a normal mood and affect. Her behavior is normal.    BP 104/62   Pulse 84   Temp 98 F (36.7 C) (Oral)   Ht 5\' 4"  (1.626 m)   Wt 194 lb 3.2 oz (88.1 kg)   LMP 12/10/2015   SpO2 96%   BMI 33.33 kg/m  Wt Readings from Last 3 Encounters:  12/28/15 194 lb 3.2 oz (88.1 kg)  11/27/15 192 lb (87.1 kg)  10/23/15 191 lb (86.6 kg)     Lab Results  Component Value Date   WBC 7.7 09/21/2015   HGB 13.6 09/21/2015   HCT 39.3 09/21/2015   PLT 316 09/21/2015   GLUCOSE 91 09/21/2015   NA 140 09/21/2015   K 3.9 09/21/2015   CL 106 09/21/2015   CREATININE 0.88 09/21/2015   BUN 13 09/21/2015   CO2 27 09/21/2015    Dg Chest 2 View  Result Date: 09/21/2015 CLINICAL DATA:  Fever EXAM: CHEST  2 VIEW COMPARISON:  None. FINDINGS: Lungs are clear. Heart size and pulmonary vascularity are normal. No adenopathy. There is lower thoracic levoscoliosis. IMPRESSION: No edema or consolidation. Electronically Signed   By: Lowella Grip III M.D.   On: 09/21/2015 10:53   Ct Head Wo Contrast  Result Date: 09/21/2015 CLINICAL DATA:  Headache for 2 weeks which has worsened over the past week. History of migraines. No known injury. EXAM: CT HEAD WITHOUT CONTRAST TECHNIQUE: Contiguous axial images were obtained from the base of the skull through the vertex without intravenous contrast. COMPARISON:  None. FINDINGS: The brain appears normal without hemorrhage, infarct, mass lesion, mass effect, midline shift or abnormal extra-axial fluid collection. No hydrocephalus or pneumocephalus. The calvarium is intact. Imaged paranasal sinuses and mastoid  air cells are clear. IMPRESSION: Negative head CT. Electronically Signed   By: Inge Rise M.D.   On: 09/21/2015 10:45   Ct Angio Neck W/cm &/or Wo/cm  Result Date: 09/21/2015 CLINICAL DATA:  Left neck pain.  Rule out carotid dissection. EXAM: CT ANGIOGRAPHY NECK TECHNIQUE: Multidetector CT imaging of the neck was performed using the standard protocol during bolus administration of intravenous contrast. Multiplanar CT image reconstructions and MIPs were obtained to evaluate the vascular anatomy. Carotid stenosis measurements (when applicable) are obtained utilizing NASCET criteria, using the distal internal carotid diameter as the denominator. CONTRAST:  75 mL Isovue 370 IV COMPARISON:  CT head 09/21/2015 FINDINGS: Aortic arch: Normal aortic arch without dissection or aneurysm. Proximal great vessels widely patent. Right carotid system:  Normal right carotid. No atherosclerotic disease or dissection or aneurysm. Left carotid system: Normal left carotid. Negative for dissection or aneurysm. No significant stenosis. Vertebral arteries:Both vertebral arteries are widely patent without stenosis or dissection. No aneurysm. Skeleton: Disc degeneration and spondylosis at C4-5. Mild spinal stenosis. Mild left foraminal narrowing due to spurring. Negative for fracture or mass lesion. Other neck: Lung apices clear. IMPRESSION: Negative for carotid or vertebral artery dissection. No significant arterial stenosis in the neck Disc degeneration and spondylosis at C4-5 with mild spinal stenosis. Electronically Signed   By: Franchot Gallo M.D.   On: 09/21/2015 14:15       Assessment & Plan:   Problem List Items Addressed This Visit    Fibrocystic disease of breast    Schedule mammogram - overdue.        Hypercholesterolemia    Low cholesterol diet and exercise.  Follow lipid panel.        Relevant Orders   CBC with Differential/Platelet   Comprehensive metabolic panel   TSH   Lipid panel   IBS (irritable  bowel syndrome)    Bowels better.  Discussed need for colonoscopy.        Migraine - Primary    Seeing neurology now.  He has adjusted her medications.  Doing well on current regimen.  Follow.        Spastic bladder    Reports having a history of spastic bladder and weakness.   Has tried ditropan.  Did not tolerate.  Will refer to therapy for pelvic floor exercises.        Relevant Orders   Ambulatory referral to Physical Therapy    Other Visit Diagnoses    Encounter for immunization       Relevant Orders   Flu Vaccine QUAD 36+ mos IM (Completed)   MM DIGITAL SCREENING BILATERAL   Ambulatory referral to Physical Therapy   Breast cancer screening       Relevant Orders   MM DIGITAL SCREENING BILATERAL       Einar Pheasant, MD

## 2015-12-28 NOTE — Progress Notes (Signed)
Pre visit review using our clinic review tool, if applicable. No additional management support is needed unless otherwise documented below in the visit note. 

## 2015-12-30 ENCOUNTER — Encounter: Payer: Self-pay | Admitting: Internal Medicine

## 2015-12-30 DIAGNOSIS — G43909 Migraine, unspecified, not intractable, without status migrainosus: Secondary | ICD-10-CM | POA: Insufficient documentation

## 2015-12-30 DIAGNOSIS — N6019 Diffuse cystic mastopathy of unspecified breast: Secondary | ICD-10-CM | POA: Insufficient documentation

## 2015-12-30 DIAGNOSIS — K589 Irritable bowel syndrome without diarrhea: Secondary | ICD-10-CM | POA: Insufficient documentation

## 2015-12-30 DIAGNOSIS — N3289 Other specified disorders of bladder: Secondary | ICD-10-CM | POA: Insufficient documentation

## 2015-12-30 DIAGNOSIS — E7800 Pure hypercholesterolemia, unspecified: Secondary | ICD-10-CM | POA: Insufficient documentation

## 2015-12-30 NOTE — Assessment & Plan Note (Signed)
Schedule mammogram - overdue.

## 2015-12-30 NOTE — Assessment & Plan Note (Signed)
Bowels better.  Discussed need for colonoscopy.

## 2015-12-30 NOTE — Assessment & Plan Note (Signed)
Reports having a history of spastic bladder and weakness.   Has tried ditropan.  Did not tolerate.  Will refer to therapy for pelvic floor exercises.

## 2015-12-30 NOTE — Assessment & Plan Note (Signed)
Seeing neurology now.  He has adjusted her medications.  Doing well on current regimen.  Follow.

## 2015-12-30 NOTE — Assessment & Plan Note (Signed)
Low cholesterol diet and exercise.  Follow lipid panel.   

## 2016-01-27 ENCOUNTER — Ambulatory Visit (INDEPENDENT_AMBULATORY_CARE_PROVIDER_SITE_OTHER): Payer: BLUE CROSS/BLUE SHIELD | Admitting: Neurology

## 2016-01-27 DIAGNOSIS — G4710 Hypersomnia, unspecified: Secondary | ICD-10-CM

## 2016-01-27 DIAGNOSIS — G4720 Circadian rhythm sleep disorder, unspecified type: Secondary | ICD-10-CM

## 2016-01-29 ENCOUNTER — Ambulatory Visit (INDEPENDENT_AMBULATORY_CARE_PROVIDER_SITE_OTHER): Payer: BLUE CROSS/BLUE SHIELD | Admitting: Neurology

## 2016-01-29 ENCOUNTER — Encounter: Payer: Self-pay | Admitting: Neurology

## 2016-01-29 VITALS — BP 122/80 | HR 96 | Ht 64.0 in | Wt 193.3 lb

## 2016-01-29 DIAGNOSIS — G43829 Menstrual migraine, not intractable, without status migrainosus: Secondary | ICD-10-CM | POA: Diagnosis not present

## 2016-01-29 MED ORDER — NORTRIPTYLINE HCL 50 MG PO CAPS: 50.0000 mg | ORAL_CAPSULE | Freq: Every day | ORAL | 3 refills | Status: DC

## 2016-01-29 MED ORDER — SUMATRIPTAN SUCCINATE 100 MG PO TABS
ORAL_TABLET | ORAL | 2 refills | Status: DC
Start: 2016-01-29 — End: 2016-05-09

## 2016-01-29 MED ORDER — NAPROXEN 500 MG PO TABS: ORAL_TABLET | ORAL | 2 refills | Status: DC

## 2016-01-29 NOTE — Progress Notes (Signed)
NEUROLOGY FOLLOW UP OFFICE NOTE  Natasha Cook [id number]  HISTORY OF PRESENT ILLNESS: Natasha Cook is a 50 year old left-handed woman with histoplasmosis, migraine and history of pericarditis who follows up for migraine.  UPDATE: Neck pain improved with dry needling She was referred to sleep medicine for evaluation of OSA.  She had a sleep study over the weekend and is still waiting for results. She no longer has the daily headaches but migraines still occur about 6 or 7 days a month.  Intensity is less as well.  Intensity:  No more than 8/10 Duration:  2 hours Frequency:  6 to 7 days per month. Current NSAIDS: naproxen 500mg  (with sumatriptan) Current analgesics:  no Current triptans:  sumatriptan 100mg  (with naproxen 500mg ) Current anti-emetic:  no Current muscle relaxants:  no Current anti-anxiolytic:  no Current sleep aide:  no Current Antihypertensive medications:  no Current Antidepressant medications:  nortriptyline 25mg  Current Anticonvulsant medications:  no Current Vitamins/Herbal/Supplements:  no Current Antihistamines/Decongestants:  no Other therapy:  Dry needling   Caffeine:  1 cup coffee daily, 1 diet Coke every other day Alcohol:  occasional Smoker:  no Diet:  hydrates Exercise:  30 minute walk daily Depression/stress:  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  Snores.  Possibly with apneic episodes  HISTORY:  Onset:  childhood Location:  Usually right sided, from back of head to behind right eye (sometimes left-sided) Quality:  Throbbing headache, stabbing eye pain Initial Intensity:  8-9/10 Aura:  Blurred vision in right eye Prodrome:  Hunger for caffeine and sugar Associated symptoms:  Nausea, vomiting, photophobia, phonophobia, osmophobia.  No autonomic symptoms. Initial Duration:  4 hours (if sumatriptan works) up to 1 day.  It lasts 2 to 3 days around her period Initial Frequency:  Just prior and after her  period.  Otherwise, every other week (total of 7 migraine days per month, but has daily dull headache) Triggers/exacerbating factors:  Fluorescent light, red wine, scents (perfume), glare Relieving factors:  no Activity:  aggravates   Past NSAIDS:  ibuprofen Past analgesics:  Excedrin, acetaminophen/caffeine/dihydrocodone Past abortive triptans:  Sumatriptan NS (nausea) and injection (effective but inconvenient) Past muscle relaxants:  no Past anti-emetic:  no Past anti-anxiolytic:  no Past antihypertensive medications:  no Past antidepressant medications:  no Past anticonvulsant medications:  topiramate 100mg  (increased dose caused cognitive deficits and blurred vision) Past vitamins/Herbal/Supplements:  no Past antihistamines/decongestants:  no Other past medications:  no   Family history of headache:  yes   She presented to the ED on 09/06/14/17 with pain and ice cold sensation of entire left arm with pain in the neck.  CBC and BMP were unremarkable.  CT of head was personally reviewed and was unremarkable.  CTA of neck was personally reviewed and was negative for carotid or vertebral artery dissection.  It also revealed disc degeneration and spondylosis at C4-5 with mild spinal stenosis.   Over the past 2 years, she has noticed problems with balance.  She works in a physical therapy class.  When she stands on one foot, she feels off balance.  If she is in the shower and closes her eyes and turns her head, she needs to hold on.  She denies dizziness but notes fullness in ears.  She denies dizziness.  PAST MEDICAL HISTORY: Past Medical History:  Diagnosis Date  . Histoplasmosis   . Hypercholesterolemia   . IBS (irritable bowel syndrome)   . Migraine   .  Pericarditis     MEDICATIONS: Current Outpatient Prescriptions on File Prior to Visit  Medication Sig Dispense Refill  . Multiple Vitamin (MULTIVITAMIN) capsule Take 1 capsule by mouth daily.     No current facility-administered  medications on file prior to visit.     ALLERGIES: Allergies  Allergen Reactions  . Codeine Rash  . Penicillins Rash    FAMILY HISTORY: Family History  Problem Relation Age of Onset  . Skin cancer Mother   . Migraines Mother   . Tongue cancer Father   . Stroke Father   . Cancer Maternal Grandmother   . Cancer Maternal Grandfather   . Stroke Paternal Grandmother     SOCIAL HISTORY: Social History   Social History  . Marital status: Married    Spouse name: N/A  . Number of children: 0  . Years of education: PhD   Occupational History  . Not on file.   Social History Main Topics  . Smoking status: Never Smoker  . Smokeless tobacco: Never Used  . Alcohol use Yes  . Drug use: No  . Sexual activity: Not on file   Other Topics Concern  . Not on file   Social History Narrative   Drinks 1-2 cups of coffee a day.     REVIEW OF SYSTEMS: Constitutional: No fevers, chills, or sweats, no generalized fatigue, change in appetite Eyes: No visual changes, double vision, eye pain Ear, nose and throat: No hearing loss, ear pain, nasal congestion, sore throat Cardiovascular: No chest pain, palpitations Respiratory:  No shortness of breath at rest or with exertion, wheezes GastrointestinaI: No nausea, vomiting, diarrhea, abdominal pain, fecal incontinence Genitourinary:  No dysuria, urinary retention or frequency Musculoskeletal:  No neck pain, back pain Integumentary: No rash, pruritus, skin lesions Neurological: as above Psychiatric: No depression, insomnia, anxiety Endocrine: No palpitations, fatigue, diaphoresis, mood swings, change in appetite, change in weight, increased thirst Hematologic/Lymphatic:  No purpura, petechiae. Allergic/Immunologic: no itchy/runny eyes, nasal congestion, recent allergic reactions, rashes  PHYSICAL EXAM: Vitals:   01/29/16 0841  BP: 122/80  Pulse: 96   General: No acute distress.  Patient appears well-groomed.  Head:   Normocephalic/atraumatic Eyes:  Fundi examined but not visualized Neck: supple, no paraspinal tenderness, full range of motion Heart:  Regular rate and rhythm Lungs:  Clear to auscultation bilaterally Back: No paraspinal tenderness Neurological Exam: alert and oriented to person, place, and time. Attention span and concentration intact, recent and remote memory intact, fund of knowledge intact.  Speech fluent and not dysarthric, language intact.  CN II-XII intact. Bulk and tone normal, muscle strength 5/5 throughout.  Sensation to light touch  intact.  Deep tendon reflexes 2+ throughout.  Finger to nose testing intact.  Gait normal  IMPRESSION: Migraine  PLAN: 1.  No longer with daily headache but will increase nortriptyline to 50mg  at bedtime in order to try and reduce frequency. 2.  Sumatriptan and naproxen for abortive therapy 3.  Follow up in 3 months.  15 minutes spent face to face with patient, over 50% spent counseling.  Metta Clines, DO  CC:  Einar Pheasant, MD

## 2016-01-30 NOTE — Progress Notes (Signed)
Chart forwarded.  

## 2016-01-31 ENCOUNTER — Other Ambulatory Visit (INDEPENDENT_AMBULATORY_CARE_PROVIDER_SITE_OTHER): Payer: BLUE CROSS/BLUE SHIELD

## 2016-01-31 DIAGNOSIS — E7800 Pure hypercholesterolemia, unspecified: Secondary | ICD-10-CM | POA: Diagnosis not present

## 2016-01-31 LAB — COMPREHENSIVE METABOLIC PANEL
ALBUMIN: 4.2 g/dL (ref 3.5–5.2)
ALK PHOS: 58 U/L (ref 39–117)
ALT: 20 U/L (ref 0–35)
AST: 9 U/L (ref 0–37)
BUN: 13 mg/dL (ref 6–23)
CALCIUM: 9.5 mg/dL (ref 8.4–10.5)
CO2: 29 mEq/L (ref 19–32)
Chloride: 101 mEq/L (ref 96–112)
Creatinine, Ser: 0.91 mg/dL (ref 0.40–1.20)
GFR: 69.26 mL/min (ref >60.00)
Glucose, Bld: 101 mg/dL — ABNORMAL HIGH (ref 70–99)
POTASSIUM: 3.9 meq/L (ref 3.5–5.1)
Sodium: 138 mEq/L (ref 135–145)
TOTAL PROTEIN: 7.3 g/dL (ref 6.0–8.3)
Total Bilirubin: 0.8 mg/dL (ref 0.2–1.2)

## 2016-01-31 LAB — LDL CHOLESTEROL, DIRECT: LDL DIRECT: 129 mg/dL

## 2016-01-31 LAB — LIPID PANEL
Cholesterol: 234 mg/dL — ABNORMAL HIGH (ref 0–200)
HDL: 42.2 mg/dL (ref >39.00)
NonHDL: 191.5
Total CHOL/HDL Ratio: 6
Triglycerides: 390 mg/dL — ABNORMAL HIGH (ref 0.0–149.0)
VLDL: 78 mg/dL — ABNORMAL HIGH (ref 0.0–40.0)

## 2016-01-31 LAB — CBC WITH DIFFERENTIAL/PLATELET
Basophils Absolute: 0 10*3/uL (ref 0.0–0.1)
Basophils Relative: 0.2 % (ref 0.0–3.0)
EOS PCT: 2.2 % (ref 0.0–5.0)
Eosinophils Absolute: 0.3 10*3/uL (ref 0.0–0.7)
HEMATOCRIT: 42.7 % (ref 36.0–46.0)
HEMOGLOBIN: 14.5 g/dL (ref 12.0–15.0)
Lymphocytes Relative: 15.7 % (ref 12.0–46.0)
Lymphs Abs: 2 10*3/uL (ref 0.7–4.0)
MCHC: 34 g/dL (ref 30.0–36.0)
MCV: 84.2 fl (ref 78.0–100.0)
MONOS PCT: 6.2 % (ref 3.0–12.0)
Monocytes Absolute: 0.8 10*3/uL (ref 0.1–1.0)
Neutro Abs: 9.7 10*3/uL — ABNORMAL HIGH (ref 1.4–7.7)
Neutrophils Relative %: 75.7 % (ref 43.0–77.0)
Platelets: 439 10*3/uL — ABNORMAL HIGH (ref 150.0–400.0)
RBC: 5.07 Mil/uL (ref 3.87–5.11)
RDW: 13.5 % (ref 11.5–15.5)
WBC: 12.8 10*3/uL — AB (ref 4.0–10.5)

## 2016-01-31 LAB — TSH: TSH: 2.67 u[IU]/mL (ref 0.35–4.50)

## 2016-02-01 ENCOUNTER — Other Ambulatory Visit: Payer: Self-pay | Admitting: Internal Medicine

## 2016-02-01 DIAGNOSIS — D75839 Thrombocytosis, unspecified: Secondary | ICD-10-CM

## 2016-02-01 DIAGNOSIS — D473 Essential (hemorrhagic) thrombocythemia: Secondary | ICD-10-CM

## 2016-02-01 NOTE — Progress Notes (Signed)
Order placed for f/u lab  Dr Nicki Reaper

## 2016-02-04 ENCOUNTER — Telehealth: Payer: Self-pay | Admitting: Neurology

## 2016-02-04 NOTE — Telephone Encounter (Signed)
I spoke to patient and she is aware of results and recommendations. I will mail her a sleep hygiene information sheet. I have also sent the report to Dr. Nicki Reaper and Dr. Tomi Likens.

## 2016-02-04 NOTE — Telephone Encounter (Signed)
Patient referred by Dr. Tomi Likens, seen by me on 11/27/15, diagnostic PSG on 01/27/16.   Please call and notify the patient that the recent sleep study did not show any significant obstructive sleep apnea or intrinsic sleep d/o. Please inform patient that I can see her in sleep clinic as needed, follow up with Drs. Tomi Likens and Scott (PCP) as scheduled.  Please remind patient to try to maintain good sleep hygiene, which means: Keep a regular sleep and wake schedule, try not to exercise or have a meal within 2 hours of your bedtime, try to keep your bedroom conducive for sleep, that is, cool and dark, without light distractors such as an illuminated alarm clock, and refrain from watching TV right before sleep or in the middle of the night and do not keep the TV or radio on during the night. Also, try not to use or play on electronic devices at bedtime, such as your cell phone, tablet PC or laptop. If you like to read at bedtime on an electronic device, try to dim the background light as much as possible. Do not eat in the middle of the night.   Also, route or fax report to PCP and referring MD, if other than PCP.  Once you have spoken to patient, you can close this encounter.   Thanks,  Star Age, MD, PhD [facility])

## 2016-02-04 NOTE — Progress Notes (Signed)
PATIENT'S NAME:  Natasha Cook, Natasha Cook DOB:      1965-04-25      MR#:    [id number]     DATE OF RECORDING: 01/27/2016 REFERRING M.D.: Metta Clines, MD, PCP: Einar Pheasant, MD Study Performed:   Baseline Polysomnogram HISTORY:  50 year old woman with a history of histoplasmosis, pericarditis, migraine headaches and obesity, who reports snoring and excessive daytime somnolence, morning HAs and nocturia. The female patient endorsed the Epworth Sleepiness Scale at 7 points and the Fatigue Score at 27 points.   The patient's weight 192 pounds with a height of 64 (inches), resulting in a BMI of 32.7 kg/m2. The patient's neck circumference measured 14 inches.  CURRENT MEDICATIONS: Mag-Ox, Multivitamin, Naprosyn, Pamelor, Imitrex, Norco   PROCEDURE:  This is a multichannel digital polysomnogram utilizing the Somnostar 11.2 system.  Electrodes and sensors were applied and monitored per AASM Specifications.   EEG, EOG, Chin and Limb EMG, were sampled at 200 Hz.  ECG, Snore and Nasal Pressure, Thermal Airflow, Respiratory Effort, CPAP Flow and Pressure, Oximetry was sampled at 50 Hz. Digital video and audio were recorded.      BASELINE STUDY  Lights Out was at 22:12 and Lights On at 05:09.  Total recording time (TRT) was 417.5 minutes, with a total sleep time (TST) of  384 minutes.   The patient's sleep latency was 18 minutes.  REM latency was 76 minutes, which is normal. The sleep efficiency was 92. %, which is normal.     SLEEP ARCHITECTURE: WASO (Wake after sleep onset) was 24.5 minutes with minimal sleep fragmentation noted.  There were 5 minutes in Stage N1, 229 minutes Stage N2, 115.5 minutes Stage N3 and 34.5 minutes in Stage REM.  The percentage of Stage N1 was 1.3%, Stage N2 was 59.6%, which is mildly increased, Stage N3 was 30.1%, which is increased and Stage R (REM sleep) was 9.%, which is reduced.  The video and audio analysis did not show any abnormal or unusual behaviors, movements, phonations or  vocalizations.  RESPIRATORY ANALYSIS:  There were a total of 26 respiratory events:  8 obstructive apneas, 0 central apneas and 4 mixed apneas with a total of 12 apneas and an apnea index (AI) of 1.9 /hour. There were 14 hypopneas with a hypopnea index of 2.2 /hour. The patient also had 0 respiratory event related arousals (RERAs).      The total APNEA/HYPOPNEA INDEX (AHI) was 4.1/hour and the total RESPIRATORY DISTURBANCE INDEX was 4.1 /hour.  17 events occurred in REM sleep and 10 events in NREM. The REM AHI was 29.6 /hour, versus a non-REM AHI of 1.5. The patient spent 167 minutes of total sleep time in the supine position and 217 minutes in non-supine.. The supine AHI was 8.7 versus a non-supine AHI of 0.6.  OXYGEN SATURATION & C02:  The Wake baseline 02 saturation was 98%, with the lowest being 87%. Time spent below 89% saturation equaled 0 minutes.  Mild intermittent snoring was noted.  The patient took one bathroom break.  EKG was in keeping with NSR.   PERIODIC LIMB MOVEMENTS:   The patient had a total of 0 Periodic Limb Movements.  The Periodic Limb Movement (PLM) index was 0 and the PLM Arousal index was 0 /hour.   IMPRESSION:  1.  Dysfunctions associated with sleep stages or arousal from sleep   RECOMMENDATIONS:  1. This study does not demonstrate any significant obstructive or central sleep disordered breathing. This study does not support an intrinsic sleep disorder as  a cause of the patient's symptoms. Other causes, including circadian rhythm disturbances, an underlying mood disorder, medication effect and/or an underlying medical problem cannot be ruled out. 2. The patient should be cautioned not to drive, work at heights, or operate dangerous or heavy equipment when tired or sleepy. Review and reiteration of good sleep hygiene measures should be pursued with any patient. 3. This study shows sleep fragmentation and abnormal sleep stage percentages; these are nonspecific  findings and per se do not signify an intrinsic sleep disorder or a cause for the patient's sleep-related symptoms. Causes include (but are not limited to) the first night effect of the sleep study, circadian rhythm disturbances, medication effect or an underlying mood disorder or medical problem.  4. The patient can follow-up with her referring provider, who will be notified of the test results.    I certify that I have reviewed the entire raw data recording prior to the issuance of this report in accordance with the Standards of Accreditation of the [place] [employer] (AASM)       Star Age, MD,PhD Diplomat, American Board of Psychiatry and Neurology  Diplomat, [place] of [employer] Medicine

## 2016-02-19 ENCOUNTER — Other Ambulatory Visit (INDEPENDENT_AMBULATORY_CARE_PROVIDER_SITE_OTHER): Payer: BLUE CROSS/BLUE SHIELD

## 2016-02-19 DIAGNOSIS — D473 Essential (hemorrhagic) thrombocythemia: Secondary | ICD-10-CM

## 2016-02-19 DIAGNOSIS — D75839 Thrombocytosis, unspecified: Secondary | ICD-10-CM

## 2016-02-19 LAB — CBC WITH DIFFERENTIAL/PLATELET
BASOS ABS: 0.1 10*3/uL (ref 0.0–0.1)
Basophils Relative: 0.8 % (ref 0.0–3.0)
EOS ABS: 0.3 10*3/uL (ref 0.0–0.7)
Eosinophils Relative: 4.1 % (ref 0.0–5.0)
HEMATOCRIT: 43.5 % (ref 36.0–46.0)
HEMOGLOBIN: 14.7 g/dL (ref 12.0–15.0)
LYMPHS PCT: 20.2 % (ref 12.0–46.0)
Lymphs Abs: 1.5 10*3/uL (ref 0.7–4.0)
MCHC: 33.7 g/dL (ref 30.0–36.0)
MCV: 84 fl (ref 78.0–100.0)
MONOS PCT: 5.6 % (ref 3.0–12.0)
Monocytes Absolute: 0.4 10*3/uL (ref 0.1–1.0)
NEUTROS ABS: 5.1 10*3/uL (ref 1.4–7.7)
Neutrophils Relative %: 69.3 % (ref 43.0–77.0)
PLATELETS: 381 10*3/uL (ref 150.0–400.0)
RBC: 5.19 Mil/uL — AB (ref 3.87–5.11)
RDW: 13.5 % (ref 11.5–15.5)
WBC: 7.3 10*3/uL (ref 4.0–10.5)

## 2016-03-06 ENCOUNTER — Encounter (INDEPENDENT_AMBULATORY_CARE_PROVIDER_SITE_OTHER): Payer: Self-pay

## 2016-03-06 ENCOUNTER — Ambulatory Visit (INDEPENDENT_AMBULATORY_CARE_PROVIDER_SITE_OTHER): Payer: BLUE CROSS/BLUE SHIELD | Admitting: Internal Medicine

## 2016-03-06 ENCOUNTER — Encounter: Payer: Self-pay | Admitting: Internal Medicine

## 2016-03-06 ENCOUNTER — Other Ambulatory Visit (HOSPITAL_COMMUNITY)
Admission: RE | Admit: 2016-03-06 | Discharge: 2016-03-06 | Disposition: A | Discharge status: Home / Self Care | Payer: BLUE CROSS/BLUE SHIELD | Source: Ambulatory Visit | Attending: Internal Medicine | Admitting: Internal Medicine

## 2016-03-06 VITALS — BP 110/72 | HR 99 | Temp 98.2°F | Ht 64.0 in | Wt 195.6 lb

## 2016-03-06 DIAGNOSIS — G43909 Migraine, unspecified, not intractable, without status migrainosus: Secondary | ICD-10-CM

## 2016-03-06 DIAGNOSIS — Z0000 Encounter for general adult medical examination without abnormal findings: Secondary | ICD-10-CM

## 2016-03-06 DIAGNOSIS — N6019 Diffuse cystic mastopathy of unspecified breast: Secondary | ICD-10-CM | POA: Diagnosis not present

## 2016-03-06 DIAGNOSIS — Z1239 Encounter for other screening for malignant neoplasm of breast: Secondary | ICD-10-CM | POA: Diagnosis not present

## 2016-03-06 DIAGNOSIS — Z124 Encounter for screening for malignant neoplasm of cervix: Secondary | ICD-10-CM | POA: Diagnosis not present

## 2016-03-06 DIAGNOSIS — E7800 Pure hypercholesterolemia, unspecified: Secondary | ICD-10-CM

## 2016-03-06 DIAGNOSIS — Z01419 Encounter for gynecological examination (general) (routine) without abnormal findings: Secondary | ICD-10-CM | POA: Diagnosis present

## 2016-03-06 DIAGNOSIS — Z1151 Encounter for screening for human papillomavirus (HPV): Secondary | ICD-10-CM | POA: Insufficient documentation

## 2016-03-06 DIAGNOSIS — K589 Irritable bowel syndrome without diarrhea: Secondary | ICD-10-CM

## 2016-03-06 DIAGNOSIS — K219 Gastro-esophageal reflux disease without esophagitis: Secondary | ICD-10-CM

## 2016-03-06 MED ORDER — PANTOPRAZOLE SODIUM 40 MG PO TBEC: 40.0000 mg | DELAYED_RELEASE_TABLET | Freq: Every day | ORAL | 2 refills | Status: DC

## 2016-03-06 NOTE — Progress Notes (Signed)
Patient ID: Natasha Cook, female   DOB: 12/28/1965, 50 y.o.   MRN: [id number]   Subjective:    Patient ID: Natasha Cook, female    DOB: 1965/04/09, 50 y.o.   MRN: [id number]  HPI  Patient here for her physical exam.  Has a history of migraine headaches.  Sees Dr Tomi Likens.  On nortriptyline.  Headaches are doing better.  Has imitrex for abortive therapy.  Had negative sleep study.  Tries to stay active.  No chest pain.  No sob.  Does report increased acid reflux.  States has been taking a lot of Tums.  No abdominal pain.  No urine or bowel change.  Handling stress.     Past Medical History:  Diagnosis Date  . Histoplasmosis   . Hypercholesterolemia   . IBS (irritable bowel syndrome)   . Migraine   . Pericarditis    Past Surgical History:  Procedure Laterality Date  . CHOLECYSTECTOMY    . THORACOTOMY    . THORACOTOMY  1998  . WRIST SURGERY     Family History  Problem Relation Age of Onset  . Skin cancer Mother   . Migraines Mother   . Tongue cancer Father   . Stroke Father   . Cancer Maternal Grandmother   . Cancer Maternal Grandfather   . Stroke Paternal Grandmother    Social History   Social History  . Marital status: Married    Spouse name: N/A  . Number of children: 0  . Years of education: PhD   Social History Main Topics  . Smoking status: Never Smoker  . Smokeless tobacco: Never Used  . Alcohol use Yes  . Drug use: No  . Sexual activity: Not Asked   Other Topics Concern  . None   Social History Narrative   Drinks 1-2 cups of coffee a day.     Outpatient Encounter Prescriptions as of 03/06/2016  Medication Sig  . Multiple Vitamin (MULTIVITAMIN) capsule Take 1 capsule by mouth daily.  . naproxen (NAPROSYN) 500 MG tablet Take 1 tablet with each dose of sumatriptan  . nortriptyline (PAMELOR) 50 MG capsule Take 1 capsule (50 mg total) by mouth at bedtime.  . SUMAtriptan (IMITREX) 100 MG tablet Take 1 tab at earliest onset of headache.  May repeat once in 2  hours if headache persists or recurs.  . pantoprazole (PROTONIX) 40 MG tablet Take 1 tablet (40 mg total) by mouth daily.   No facility-administered encounter medications on file as of 03/06/2016.     Review of Systems  Constitutional: Negative for appetite change and unexpected weight change.  HENT: Negative for congestion and sinus pressure.   Eyes: Negative for pain and visual disturbance.  Respiratory: Negative for cough, chest tightness and shortness of breath.   Cardiovascular: Negative for chest pain, palpitations and leg swelling.  Gastrointestinal: Negative for abdominal pain, diarrhea, nausea and vomiting.       Increased acid reflux.    Genitourinary: Negative for difficulty urinating and dysuria.  Musculoskeletal: Negative for back pain and joint swelling.  Skin: Negative for color change and rash.  Neurological: Negative for dizziness, light-headedness and headaches.  Hematological: Negative for adenopathy. Does not bruise/bleed easily.  Psychiatric/Behavioral: Negative for agitation and dysphoric mood.       Objective:    Physical Exam  Constitutional: She is oriented to person, place, and time. She appears well-developed and well-nourished. No distress.  HENT:  Nose: Nose normal.  Mouth/Throat: Oropharynx is clear and moist.  Eyes: Right  eye exhibits no discharge. Left eye exhibits no discharge. No scleral icterus.  Neck: Neck supple. No thyromegaly present.  Cardiovascular: Normal rate and regular rhythm.   Pulmonary/Chest: Breath sounds normal. No accessory muscle usage. No tachypnea. No respiratory distress. She has no decreased breath sounds. She has no wheezes. She has no rhonchi. Right breast exhibits no inverted nipple, no mass, no nipple discharge and no tenderness (no axillary adenopathy). Left breast exhibits no inverted nipple, no mass, no nipple discharge and no tenderness (no axilarry adenopathy).  Abdominal: Soft. Bowel sounds are normal. There is no  tenderness.  Genitourinary:  Genitourinary Comments: Normal external genitalia.  Vaginal vault without lesions.  Cervix identified.  Pap smear performed.  Could not appreciate any adnexal masses or tenderness.    Musculoskeletal: She exhibits no edema or tenderness.  Lymphadenopathy:    She has no cervical adenopathy.  Neurological: She is alert and oriented to person, place, and time.  Skin: Skin is warm. No rash noted. No erythema.  Psychiatric: She has a normal mood and affect. Her behavior is normal.    BP 110/72   Pulse 99   Temp 98.2 F (36.8 C) (Oral)   Ht 5\' 4"  (1.626 m)   Wt 195 lb 9.6 oz (88.7 kg)   LMP 02/05/2016 (Approximate)   SpO2 96%   BMI 33.57 kg/m  Wt Readings from Last 3 Encounters:  03/06/16 195 lb 9.6 oz (88.7 kg)  01/29/16 193 lb 4.8 oz (87.7 kg)  12/28/15 194 lb 3.2 oz (88.1 kg)     Lab Results  Component Value Date   WBC 7.3 02/19/2016   HGB 14.7 02/19/2016   HCT 43.5 02/19/2016   PLT 381.0 02/19/2016   GLUCOSE 101 (H) 01/31/2016   CHOL 234 (H) 01/31/2016   TRIG 390.0 (H) 01/31/2016   HDL 42.20 01/31/2016   LDLDIRECT 129.0 01/31/2016   ALT 20 01/31/2016   AST 9 01/31/2016   NA 138 01/31/2016   K 3.9 01/31/2016   CL 101 01/31/2016   CREATININE 0.91 01/31/2016   BUN 13 01/31/2016   CO2 29 01/31/2016   TSH 2.67 01/31/2016    Dg Chest 2 View  Result Date: 09/21/2015 CLINICAL DATA:  Fever EXAM: CHEST  2 VIEW COMPARISON:  None. FINDINGS: Lungs are clear. Heart size and pulmonary vascularity are normal. No adenopathy. There is lower thoracic levoscoliosis. IMPRESSION: No edema or consolidation. Electronically Signed   By: Lowella Grip III M.D.   On: 09/21/2015 10:53   Ct Head Wo Contrast  Result Date: 09/21/2015 CLINICAL DATA:  Headache for 2 weeks which has worsened over the past week. History of migraines. No known injury. EXAM: CT HEAD WITHOUT CONTRAST TECHNIQUE: Contiguous axial images were obtained from the base of the skull through  the vertex without intravenous contrast. COMPARISON:  None. FINDINGS: The brain appears normal without hemorrhage, infarct, mass lesion, mass effect, midline shift or abnormal extra-axial fluid collection. No hydrocephalus or pneumocephalus. The calvarium is intact. Imaged paranasal sinuses and mastoid air cells are clear. IMPRESSION: Negative head CT. Electronically Signed   By: Inge Rise M.D.   On: 09/21/2015 10:45   Ct Angio Neck W/cm &/or Wo/cm  Result Date: 09/21/2015 CLINICAL DATA:  Left neck pain.  Rule out carotid dissection. EXAM: CT ANGIOGRAPHY NECK TECHNIQUE: Multidetector CT imaging of the neck was performed using the standard protocol during bolus administration of intravenous contrast. Multiplanar CT image reconstructions and MIPs were obtained to evaluate the vascular anatomy. Carotid stenosis measurements (  when applicable) are obtained utilizing NASCET criteria, using the distal internal carotid diameter as the denominator. CONTRAST:  75 mL Isovue 370 IV COMPARISON:  CT head 09/21/2015 FINDINGS: Aortic arch: Normal aortic arch without dissection or aneurysm. Proximal great vessels widely patent. Right carotid system: Normal right carotid. No atherosclerotic disease or dissection or aneurysm. Left carotid system: Normal left carotid. Negative for dissection or aneurysm. No significant stenosis. Vertebral arteries:Both vertebral arteries are widely patent without stenosis or dissection. No aneurysm. Skeleton: Disc degeneration and spondylosis at C4-5. Mild spinal stenosis. Mild left foraminal narrowing due to spurring. Negative for fracture or mass lesion. Other neck: Lung apices clear. IMPRESSION: Negative for carotid or vertebral artery dissection. No significant arterial stenosis in the neck Disc degeneration and spondylosis at C4-5 with mild spinal stenosis. Electronically Signed   By: Franchot Gallo M.D.   On: 09/21/2015 14:15       Assessment & Plan:   Problem List Items  Addressed This Visit    Fibrocystic disease of breast    Schedule mammogram.        GERD (gastroesophageal reflux disease)    Increased acid reflux as outlined.  Start protonix.  Follow.  Get her back in soon to reassess.  Follow.  Avoid foods that aggravate.        Relevant Medications   pantoprazole (PROTONIX) 40 MG tablet   Healthcare maintenance    Physical today 03/06/16.  PAP 03/06/16.  Schedule mammogram.  Discussed need for colonoscopy.  Will notify me when agreeable.        Hypercholesterolemia    Low cholesterol diet and exercise.  Follow lipid panel.   Lab Results  Component Value Date   CHOL 234 (H) 01/31/2016   HDL 42.20 01/31/2016   LDLDIRECT 129.0 01/31/2016   TRIG 390.0 (H) 01/31/2016   CHOLHDL 6 01/31/2016        IBS (irritable bowel syndrome)    Bowels stable.  Discussed need for colonoscopy.  Will let me know when agreeable.        Relevant Medications   pantoprazole (PROTONIX) 40 MG tablet   Migraine    Followed by Dr Tomi Likens.  Doing well on nortriptyline.  Overall doing better.  Follow.        Other Visit Diagnoses    Encounter for breast cancer screening other than mammogram    -  Primary   Relevant Orders   MM Digital Screening   Routine cervical smear       Relevant Orders   Cytology - PAP       Einar Pheasant, MD

## 2016-03-06 NOTE — Progress Notes (Signed)
Pre visit review using our clinic review tool, if applicable. No additional management support is needed unless otherwise documented below in the visit note. 

## 2016-03-07 ENCOUNTER — Encounter: Payer: Self-pay | Admitting: Internal Medicine

## 2016-03-09 ENCOUNTER — Encounter: Payer: Self-pay | Admitting: Internal Medicine

## 2016-03-09 DIAGNOSIS — K219 Gastro-esophageal reflux disease without esophagitis: Secondary | ICD-10-CM | POA: Insufficient documentation

## 2016-03-09 DIAGNOSIS — Z0000 Encounter for general adult medical examination without abnormal findings: Secondary | ICD-10-CM | POA: Insufficient documentation

## 2016-03-09 NOTE — Assessment & Plan Note (Signed)
Physical today 03/06/16.  PAP 03/06/16.  Schedule mammogram.  Discussed need for colonoscopy.  Will notify me when agreeable.

## 2016-03-09 NOTE — Assessment & Plan Note (Signed)
Followed by Dr Tomi Likens.  Doing well on nortriptyline.  Overall doing better.  Follow.

## 2016-03-09 NOTE — Assessment & Plan Note (Signed)
Schedule mammogram.

## 2016-03-09 NOTE — Assessment & Plan Note (Signed)
Increased acid reflux as outlined.  Start protonix.  Follow.  Get her back in soon to reassess.  Follow.  Avoid foods that aggravate.

## 2016-03-09 NOTE — Assessment & Plan Note (Signed)
Low cholesterol diet and exercise.  Follow lipid panel.   Lab Results  Component Value Date   CHOL 234 (H) 01/31/2016   HDL 42.20 01/31/2016   LDLDIRECT 129.0 01/31/2016   TRIG 390.0 (H) 01/31/2016   CHOLHDL 6 01/31/2016

## 2016-03-09 NOTE — Assessment & Plan Note (Signed)
Bowels stable.  Discussed need for colonoscopy.  Will let me know when agreeable.

## 2016-03-10 LAB — CYTOLOGY - PAP
Diagnosis: NEGATIVE
HPV (WINDOPATH): NOT DETECTED

## 2016-03-17 ENCOUNTER — Encounter: Payer: Self-pay | Admitting: Physical Therapy

## 2016-03-17 ENCOUNTER — Ambulatory Visit: Payer: BLUE CROSS/BLUE SHIELD | Attending: Internal Medicine | Admitting: Physical Therapy

## 2016-03-17 DIAGNOSIS — R278 Other lack of coordination: Secondary | ICD-10-CM | POA: Diagnosis present

## 2016-03-17 DIAGNOSIS — M791 Myalgia, unspecified site: Secondary | ICD-10-CM

## 2016-03-17 DIAGNOSIS — M5440 Lumbago with sciatica, unspecified side: Secondary | ICD-10-CM | POA: Insufficient documentation

## 2016-03-17 DIAGNOSIS — R2689 Other abnormalities of gait and mobility: Secondary | ICD-10-CM | POA: Diagnosis present

## 2016-03-17 DIAGNOSIS — G8929 Other chronic pain: Secondary | ICD-10-CM | POA: Diagnosis present

## 2016-03-17 NOTE — Therapy (Signed)
[place] MAIN [facility] SERVICES [address] [place], [place], [address] Phone: [phone]   Fax:  [phone]  Physical Therapy Evaluation  Patient Details  Name: Natasha Cook MRN: [id number] Date of Birth: Jan 22, 1966 Referring Provider: Einar Pheasant, MD  Encounter Date: 03/17/2016      PT End of Session - 03/17/16 2214    Visit Number 1   Number of Visits 12   Date for PT Re-Evaluation 06/09/16   PT Start Time 1504   PT Stop Time 1605   PT Time Calculation (min) 61 min      Past Medical History:  Diagnosis Date  . Histoplasmosis   . Hypercholesterolemia   . IBS (irritable bowel syndrome)   . Migraine   . Pericarditis     Past Surgical History:  Procedure Laterality Date  . CHOLECYSTECTOMY    . THORACOTOMY    . THORACOTOMY  1998  . WRIST SURGERY      There were no vitals filed for this visit.       Subjective Assessment - 03/17/16 1513    Subjective 1) Urinary dysfunction: Pt has had spastic bladder and urinary incontinence since she was a child. Pt had not talked to anyone about the Sx until she was 5 years ago. Pt has leakage with coughing, sneezing, bending over, Zumba/high impact fitness exercise. Changing 2 pads/ day. Nocturia 4x/ night with incomplete voiding and pt stopping drinking fluids at 6pm.  Pt is undergoing pre-menopause with night-sweats and has negative results from CPAP.  Frequency: once per every 2 hour. Urgency incontinence, urge before leaving her house.  2) Bowel Dysfunction related w/ IBS-diarrhea. Stress worsens IBS- Sx. Regularity varies 2-3x/ day but food dependent. Bristol Stool Scale 6-7 Type. Sx worsened after gall bladder removal.    3) LBP /sciatica LLE down to heel. Flare-ups occur with gardening all day. Pt worked with heavy lifting and pt handling for 25 years.     Pertinent History Pertinent Hx: surgery R posterior rib area, gallbladder removal. Pt sits for 12 hrs at work.  Pt also  reports childhood behaviors with stress and frequent urination.  Pt reported a fall from the monkey bars when she was child and landed onto her perineal area.    Patient Stated Goals Return to [employer] classes without leakage             [facility] PT Assessment - 03/17/16 1550      Assessment   Medical Diagnosis spstic bladder   Referring Provider Einar Pheasant, MD     Precautions   Precautions None     Restrictions   Weight Bearing Restrictions No     Balance Screen   Has the patient fallen in the past 6 months No     Prior Function   Level of Independence Independent     Observation/Other Assessments   Observations legs crossed , leaned back against chair    Other Surveys  --  NIH-CPSI 42%, PSQI 67%, VSI 60%      Coordination   Gross Motor Movements are Fluid and Coordinated --  chest breathing   Fine Motor Movements are Fluid and Coordinated --  anterior/posterior expansion of diaphragm     AROM   Overall AROM Comments L sidebend and rotation limited > R      PROM   Overall PROM Comments R hip IR ~5 deg, L IR ~15 deg (post Tx: B 15 deg)       Palpation  Spinal mobility increased upper trap tensions   SI assessment  L PSIS tenderness, slightly more posterior, hip IR of L referred to L SIJ pain   Palpation comment Increased mm tensions B deep transverse perineal , obt int R > L      Bed Mobility   Bed Mobility --  crunch                  Pelvic Floor Special Questions - 03/17/16 2213    Diastasis Recti neg                  PT Education - 03/17/16 2213    Education provided Yes   Education Details POC, anatomy, physiology, HEP, goals   Person(s) Educated Patient   Methods Explanation;Demonstration;Tactile cues;Verbal cues;Handout   Comprehension Verbal cues required;Returned demonstration;Verbalized understanding;Tactile cues required             PT Long Term Goals - 03/17/16 2222      PT LONG TERM GOAL #1   Title  Pt will increase her score on VSI from 60% to > 70% in order to demo improved IBS management    Time 12   Period Weeks   Status New     PT LONG TERM GOAL #2   Title Pt will report improved stool consistency to Type 3-4 (Bristol Stool Scale) 50% of the time in order to improve GI and pelvic floor function   Time 12   Period Weeks   Status New     PT LONG TERM GOAL #3   Title Pt will decrease her NIH-CPSI score from 42% to < 32% in order to restore pelvic floor function with urination and QOL   Time 12   Period Weeks   Status New     PT LONG TERM GOAL #4   Title Pt will report decreased leakage with jumping on trampoline for 30 sec x 3 rep in order to return to high impact fitness classes   Time 12   Period Weeks   Status New     PT LONG TERM GOAL #5   Title Pt will decrease her PSQI score from 67% to < 57% or decreased nocturia from 4x/ night to < 2x/night  order to improve sleep quality   Time 12   Period Weeks   Status New     Additional Long Term Goals   Additional Long Term Goals Yes               Plan - 03/17/16 2214    Clinical Impression Statement Pt is a 50 yo female who c/o mixed incontinence (urge incontinence, SUI, nocturia, and difficulty with complete urination) in addition to IBS-diarrhea, and L-sided sciatica. Pt 's clincial presentations today include increased mm tensions of pelvic floor m tensions, pelvic obliquities, limited spinal ROM, dyscoordination of her deep core mm, and poor posture and body mechanics. Following Tx today, pt demo'd decreased pelvic floor mm tensions with increased PROM hip IR bilaterally.      Rehab Potential Good   Clinical Impairments Affecting Rehab Potential surgery R posterior rib area, gallbladder removal. Pt sits for 12 hrs at work.  Pt also reports childhood behaviors with stress and frequent urination. Pre-menopausal Sx (hot flashes)    PT Frequency 1x / week   PT Duration 12 weeks   PT Treatment/Interventions ADLs/Self  Care Home Management;Aquatic Therapy;Biofeedback;Cryotherapy;Therapeutic exercise;Balance training;Therapeutic activities;Functional mobility training;Stair training;Moist Heat;Neuromuscular re-education;Wheelchair mobility training;Manual techniques;Patient/family education;Energy conservation;Other (comment)  biopsychosocial approaches  Consulted and Agree with Plan of Care Patient      Patient will benefit from skilled therapeutic intervention in order to improve the following deficits and impairments:  Postural dysfunction, Increased muscle spasms, Improper body mechanics, Pain, Decreased activity tolerance, Decreased endurance, Decreased range of motion, Decreased safety awareness, Impaired flexibility, Hypomobility, Decreased coordination, Decreased mobility  Visit Diagnosis: Myalgia  Chronic left-sided low back pain with sciatica, sciatica laterality unspecified  Other abnormalities of gait and mobility  Other lack of coordination     Problem List Patient Active Problem List   Diagnosis Date Noted  . GERD (gastroesophageal reflux disease) 03/09/2016  . Healthcare maintenance 03/09/2016  . Migraine 12/30/2015  . Hypercholesterolemia 12/30/2015  . Fibrocystic disease of breast 12/30/2015  . IBS (irritable bowel syndrome) 12/30/2015  . Spastic bladder 12/30/2015    Jerl Mina ,PT, DPT, E-RYT  03/17/2016, 10:29 PM  [place] MAIN [facility] SERVICES [address] [place], [place], [address] Phone: [phone]   Fax:  [phone]  Name: Natasha Cook MRN: [id number] Date of Birth: Aug 29, 1965

## 2016-03-17 NOTE — Patient Instructions (Signed)
You are now ready to begin training the deep core muscles system: diaphragm, transverse abdominis, pelvic floor . These muscles must work together as a team.           The key to these exercises to train the brain to coordinate the timing of these muscles and to have them turn on for long periods of time to hold you upright against gravity (especially important if you are on your feet all day).These muscles are postural muscles and play a role stabilizing your spine and bodyweight. By doing these repetitions slowly and correctly instead of doing crunches, you will achieve a flatter belly without a lower pooch. You are also placing your spine in a more neutral position and breathing properly which in turn, decreases your risk for problems related to your pelvic floor, abdominal, and low back such as pelvic organ prolapse, hernias, diastasis recti (separation of superficial muscles), disk herniations, spinal fractures. These exercises set a solid foundation for you to later progress to resistance/ strength training with therabands and weights and return to other typical fitness exercises with a stronger deeper core.   Do level 1 : 10 reps Do level 2: 10 reps (left and right = 1 rep) x 3 sets , 2 x day Do not progress to level 3 for 3-4 weeks. You know you are ready when you do not have any rocking of pelvis nor arching in your back     Frog stretch: laying on belly with pillow under hips, knees bent, inhale do nothing, exhale let ankles fall apart ~4 10 reps x 3

## 2016-04-03 ENCOUNTER — Ambulatory Visit
Admission: RE | Admit: 2016-04-03 | Discharge: 2016-04-03 | Disposition: A | Discharge status: Home / Self Care | Payer: BLUE CROSS/BLUE SHIELD | Source: Ambulatory Visit | Attending: Internal Medicine | Admitting: Internal Medicine

## 2016-04-03 DIAGNOSIS — Z1231 Encounter for screening mammogram for malignant neoplasm of breast: Secondary | ICD-10-CM | POA: Diagnosis not present

## 2016-04-03 DIAGNOSIS — Z1239 Encounter for other screening for malignant neoplasm of breast: Secondary | ICD-10-CM

## 2016-04-04 ENCOUNTER — Telehealth: Payer: Self-pay | Admitting: Neurology

## 2016-04-04 MED ORDER — NORTRIPTYLINE HCL 25 MG PO CAPS: 25.0000 mg | ORAL_CAPSULE | Freq: Every day | ORAL | 3 refills | Status: DC

## 2016-04-04 NOTE — Telephone Encounter (Signed)
Patient okay with switching back to 25 mg. RX sent to pharmacy.

## 2016-04-04 NOTE — Telephone Encounter (Signed)
Patient left message on voice mail and needs to talk to someone about medication side effects please call her [phone]

## 2016-04-04 NOTE — Telephone Encounter (Signed)
She can remain on 25mg  if that is working.  Otherwise, we can switch to a different preventative

## 2016-04-04 NOTE — Telephone Encounter (Signed)
Spoke with patient.  She states a month after increasing Pamelor to 50 mg at bedtime she started experiencing increase in heart burn. She moved medication to dinner time which helped, but then she got too sleepy in the evenings.  She is also having really bad nightmares.  She did not have these side effects on 25 mg. Please advise.

## 2016-04-10 ENCOUNTER — Ambulatory Visit: Payer: BLUE CROSS/BLUE SHIELD | Attending: Internal Medicine | Admitting: Physical Therapy

## 2016-04-10 DIAGNOSIS — M791 Myalgia, unspecified site: Secondary | ICD-10-CM

## 2016-04-10 DIAGNOSIS — M5440 Lumbago with sciatica, unspecified side: Secondary | ICD-10-CM | POA: Insufficient documentation

## 2016-04-10 DIAGNOSIS — R278 Other lack of coordination: Secondary | ICD-10-CM | POA: Diagnosis present

## 2016-04-10 DIAGNOSIS — G8929 Other chronic pain: Secondary | ICD-10-CM | POA: Diagnosis present

## 2016-04-10 DIAGNOSIS — R2689 Other abnormalities of gait and mobility: Secondary | ICD-10-CM | POA: Insufficient documentation

## 2016-04-10 NOTE — Patient Instructions (Addendum)
To add to pelvic floor lengthening stretches in addition to the frog stretch:  Happy baby pose with strap on ballmounds: 5 breaths    Midback mobility: Mini cobra (thoracic ext in prone ) hands under armpits      Depress shoulders , squeeze pencils under armpits, inhale here then exhale gentle lift of chest (~5 deg) , no extension in low back       5 reps    At work: modified downward facing dog (forward flexion ) with knees unlocked using belt at your waist , hooked to the other side of the door knob               R hand on L thigh for midback twist,  And with R palm on R hip for opposite trunk rotation               3 breaths each position                  3-way hip extension 10 reps x 2     Strengthening Deep core level 2  30 reps x 2 x day

## 2016-04-11 NOTE — Therapy (Signed)
[place] [facility] [address] [place], [place], [address] Phone: [phone]   Fax:  [phone]  Physical Therapy Treatment  Patient Details  Name: Natasha Cook MRN: [id number] Date of Birth: 06-28-1965 Referring Provider: Einar Pheasant, MD  Encounter Date: 04/10/2016      PT End of Session - 04/11/16 1834    Visit Number 2   Number of Visits 12   Date for PT Re-Evaluation 06/09/16   PT Start Time [id number]   PT Stop Time 1820   PT Time Calculation (min) 67 min      Past Medical History:  Diagnosis Date  . Histoplasmosis   . Hypercholesterolemia   . IBS (irritable bowel syndrome)   . Migraine   . Pericarditis     Past Surgical History:  Procedure Laterality Date  . CHOLECYSTECTOMY    . THORACOTOMY    . THORACOTOMY  1998  . WRIST SURGERY      There were no vitals filed for this visit.      Subjective Assessment - 04/10/16 1715    Subjective Pt reported she feels sciatic pain radiating along the L SIJ and groin when she performs the frog exercise.    Pertinent History Pertinent Hx: surgery R posterior rib area, gallbladder removal. Pt sits for 12 hrs at work.  Pt also reports childhood behaviors with stress and frequent urination.  Pt reported a fall from the monkey bars when she was child and landed onto her perineal area.    Patient Stated Goals Return to [employer] classes without leakage             [facility] PT Assessment - 04/11/16 1814      Observation/Other Assessments   Observations hyperextended knees in stance, increased weight bearing on one leg     Single Leg Stance   Comments L SLS with limited L SIJ mobility , increased lumbar lordosis compensation with  Rhip ext     AROM   Overall AROM Comments pre/post Tx: R hip ext 10 deg, 15 deg. L hip ext 20 deg      PROM   Overall PROM Comments R hip IR ~15 deg post Tx     Palpation   Spinal mobility post Tx: decreased hypomobility along  T7-12 , increased diaphragmatic excursion achieved    SI assessment  L PSIS tenderness, slightly more posterior, hip IR of L did not refer to L SIJ pain   Palpation comment tensions, tenderness along lateral border of sacrum, attachments of piriformis, superior/inferior gemilli, obt int,                      [facility] Adult PT Treatment/Exercise - 04/11/16 1818      Therapeutic Activites    Therapeutic Activities --  see pt instructions     Neuro Re-ed    Neuro Re-ed Details  see pt instructions     Manual Therapy   Manual therapy comments long axis distraction BLE, MWM hip ext/ hip abd.add with PA mob along L lateral edge of sacrum, STM .  thoracic area: PA mob Grade IIIT7-12 SP  STM along lateral borders of vertebra, MWM mini low cobra                 PT Education - 04/11/16 1832    Education provided Yes   Education Details HEP   Person(s) Educated Patient   Methods Explanation;Demonstration;Tactile cues;Verbal cues;Handout   Comprehension Returned demonstration;Verbalized  understanding             PT Long Term Goals - 03/17/16 2231      PT LONG TERM GOAL #1   Title Pt will increase her score on VSI from 60% to > 70% in order to demo improved IBS management    Time 12   Period Weeks   Status New     PT LONG TERM GOAL #2   Title Pt will report improved stool consistency to Type 3-4 (Bristol Stool Scale) 50% of the time in order to improve GI and pelvic floor function   Time 12   Period Weeks   Status New     PT LONG TERM GOAL #3   Title Pt will decrease her NIH-CPSI score from 42% to < 32% in order to restore pelvic floor function with urination and QOL   Time 12   Period Weeks   Status New     PT LONG TERM GOAL #4   Title Pt will report decreased leakage with jumping on trampoline for 30 sec x 3 rep in order to return to high impact fitness classes   Time 12   Status New     PT LONG TERM GOAL #5   Title Pt will decrease her PSQI score  from 67% to < 57% or decreased nocturia from 4x/ night to < 2x/night  order to improve sleep quality   Time 12   Period Weeks   Status New               Plan - 04/11/16 1834    Clinical Impression Statement Pt showed decreased pelvic mm tensions compared to last session but today showed pelvic obliquities, L hip external rotator mm tensions, limited standing R hip ext, and increased thoracic mm tensions/ hypomobility. Following Tx , pt demo'd increased hip ext, thoracic mobility, and ability to perform hip IR in prone. Initiated stretches to perform at work today and pt demo'd ROM and correct form. Reinforced relaxation practice today. Pt will continue to benefit from skilled PT.    Rehab Potential Good   Clinical Impairments Affecting Rehab Potential surgery R posterior rib area, gallbladder removal. Pt sits for 12 hrs at work.  Pt also reports childhood behaviors with stress and frequent urination. Pre-menopausal Sx (hot flashes)    PT Frequency 1x / week   PT Duration 12 weeks   PT Treatment/Interventions ADLs/Self Care Home Management;Aquatic Therapy;Biofeedback;Cryotherapy;Therapeutic exercise;Balance training;Therapeutic activities;Functional mobility training;Stair training;Moist Heat;Neuromuscular re-education;Wheelchair mobility training;Manual techniques;Patient/family education;Energy conservation;Other (comment)  biopsychosocial approaches   Consulted and Agree with Plan of Care Patient      Patient will benefit from skilled therapeutic intervention in order to improve the following deficits and impairments:  Postural dysfunction, Increased muscle spasms, Improper body mechanics, Pain, Decreased activity tolerance, Decreased endurance, Decreased range of motion, Decreased safety awareness, Impaired flexibility, Hypomobility, Decreased coordination, Decreased mobility  Visit Diagnosis: Myalgia  Chronic left-sided low back pain with sciatica, sciatica laterality  unspecified  Other abnormalities of gait and mobility  Other lack of coordination     Problem List Patient Active Problem List   Diagnosis Date Noted  . GERD (gastroesophageal reflux disease) 03/09/2016  . Healthcare maintenance 03/09/2016  . Migraine 12/30/2015  . Hypercholesterolemia 12/30/2015  . Fibrocystic disease of breast 12/30/2015  . IBS (irritable bowel syndrome) 12/30/2015  . Spastic bladder 12/30/2015    Jerl Mina ,PT, DPT, E-RYT  04/11/2016, 6:39 PM  [facility] [place], [place], [address] Phone: [phone]   Fax:  [phone]  Name: Natasha Cook MRN: [id number] Date of Birth: Nov 14, 1965

## 2016-04-14 ENCOUNTER — Inpatient Hospital Stay
Admission: RE | Admit: 2016-04-14 | Discharge: 2016-04-14 | Disposition: A | Discharge status: Home / Self Care | Payer: Self-pay | Source: Ambulatory Visit | Attending: *Deleted | Admitting: *Deleted

## 2016-04-14 ENCOUNTER — Other Ambulatory Visit: Payer: Self-pay | Admitting: *Deleted

## 2016-04-14 DIAGNOSIS — Z9289 Personal history of other medical treatment: Secondary | ICD-10-CM

## 2016-04-16 ENCOUNTER — Ambulatory Visit: Payer: BLUE CROSS/BLUE SHIELD | Admitting: Physical Therapy

## 2016-04-24 ENCOUNTER — Ambulatory Visit: Payer: BLUE CROSS/BLUE SHIELD | Admitting: Physical Therapy

## 2016-05-02 ENCOUNTER — Telehealth: Payer: Self-pay | Admitting: Internal Medicine

## 2016-05-02 ENCOUNTER — Ambulatory Visit (INDEPENDENT_AMBULATORY_CARE_PROVIDER_SITE_OTHER): Payer: BLUE CROSS/BLUE SHIELD | Admitting: Internal Medicine

## 2016-05-02 ENCOUNTER — Encounter: Payer: Self-pay | Admitting: Internal Medicine

## 2016-05-02 DIAGNOSIS — G43909 Migraine, unspecified, not intractable, without status migrainosus: Secondary | ICD-10-CM

## 2016-05-02 DIAGNOSIS — N6019 Diffuse cystic mastopathy of unspecified breast: Secondary | ICD-10-CM

## 2016-05-02 DIAGNOSIS — K219 Gastro-esophageal reflux disease without esophagitis: Secondary | ICD-10-CM

## 2016-05-02 DIAGNOSIS — R638 Other symptoms and signs concerning food and fluid intake: Secondary | ICD-10-CM

## 2016-05-02 DIAGNOSIS — E7800 Pure hypercholesterolemia, unspecified: Secondary | ICD-10-CM

## 2016-05-02 NOTE — Progress Notes (Signed)
Patient ID: Natasha Cook, female   DOB: 19-Nov-1965, 51 y.o.   MRN: [id number]   Subjective:    Patient ID: Natasha Cook, female    DOB: [birth year], 51 y.o.   MRN: [id number]  HPI  Patient here for a scheduled follow up.  She reports that she has been under increased stress.  Increased stress with her work.  Discussed with her today.  She does not feel needs anything more at this time.  She was on increased nortriptyline for her headaches.  This made her feel more depressed and was having nightmares.  She decreased her dose to 25mg  (1/2 of previous).  Doing better now.  Still with headaches.  May take 6-7 imitrex per month.  Seeing Dr Loretta Plume.  Has f/u next month.  Breathing stable.  Discussed diet and exercise.     Past Medical History:  Diagnosis Date  . Histoplasmosis   . Hypercholesterolemia   . IBS (irritable bowel syndrome)   . Migraine   . Pericarditis    Past Surgical History:  Procedure Laterality Date  . CHOLECYSTECTOMY    . THORACOTOMY    . THORACOTOMY  1998  . WRIST SURGERY     Family History  Problem Relation Age of Onset  . Skin cancer Mother   . Migraines Mother   . Tongue cancer Father   . Stroke Father   . Cancer Maternal Grandmother   . Cancer Maternal Grandfather   . Stroke Paternal Grandmother    Social History   Social History  . Marital status: Married    Spouse name: N/A  . Number of children: 0  . Years of education: PhD   Social History Main Topics  . Smoking status: Never Smoker  . Smokeless tobacco: Never Used  . Alcohol use Yes  . Drug use: No  . Sexual activity: Not Asked   Other Topics Concern  . None   Social History Narrative   Drinks 1-2 cups of coffee a day.     Outpatient Encounter Prescriptions as of 05/02/2016  Medication Sig  . Multiple Vitamin (MULTIVITAMIN) capsule Take 1 capsule by mouth daily.  . naproxen (NAPROSYN) 500 MG tablet Take 1 tablet with each dose of sumatriptan  . nortriptyline (PAMELOR) 25 MG capsule  Take 1 capsule (25 mg total) by mouth at bedtime.  . pantoprazole (PROTONIX) 40 MG tablet Take 1 tablet (40 mg total) by mouth daily.  . SUMAtriptan (IMITREX) 100 MG tablet Take 1 tab at earliest onset of headache.  May repeat once in 2 hours if headache persists or recurs.   No facility-administered encounter medications on file as of 05/02/2016.     Review of Systems  Constitutional: Negative for appetite change and fever.  HENT: Negative for congestion and sinus pressure.   Respiratory: Negative for cough, chest tightness and shortness of breath.   Cardiovascular: Negative for chest pain, palpitations and leg swelling.  Gastrointestinal: Negative for abdominal pain, diarrhea, nausea and vomiting.  Genitourinary: Negative for difficulty urinating and dysuria.  Musculoskeletal: Negative for back pain and joint swelling.  Skin: Negative for color change and rash.  Neurological: Negative for dizziness, light-headedness and headaches.  Psychiatric/Behavioral: Negative for agitation and dysphoric mood.       Objective:     Pulse rechecked by me:  96  Physical Exam  Constitutional: She appears well-developed and well-nourished. No distress.  HENT:  Nose: Nose normal.  Mouth/Throat: Oropharynx is clear and moist.  Neck: Neck supple. No thyromegaly present.  Cardiovascular: Normal rate and regular rhythm.   Pulmonary/Chest: Breath sounds normal. No respiratory distress. She has no wheezes.  Abdominal: Soft. Bowel sounds are normal. There is no tenderness.  Musculoskeletal: She exhibits no edema or tenderness.  Lymphadenopathy:    She has no cervical adenopathy.  Skin: No rash noted. No erythema.  Psychiatric: She has a normal mood and affect. Her behavior is normal.    BP 108/80   Pulse (!) 109   Temp 98.1 F (36.7 C) (Oral)   Ht 5\' 4"  (1.626 m)   Wt 198 lb (89.8 kg)   SpO2 98%   BMI 33.99 kg/m  Wt Readings from Last 3 Encounters:  05/02/16 198 lb (89.8 kg)  03/06/16 195  lb 9.6 oz (88.7 kg)  01/29/16 193 lb 4.8 oz (87.7 kg)     Lab Results  Component Value Date   WBC 7.3 02/19/2016   HGB 14.7 02/19/2016   HCT 43.5 02/19/2016   PLT 381.0 02/19/2016   GLUCOSE 101 (H) 01/31/2016   CHOL 234 (H) 01/31/2016   TRIG 390.0 (H) 01/31/2016   HDL 42.20 01/31/2016   LDLDIRECT 129.0 01/31/2016   ALT 20 01/31/2016   AST 9 01/31/2016   NA 138 01/31/2016   K 3.9 01/31/2016   CL 101 01/31/2016   CREATININE 0.91 01/31/2016   BUN 13 01/31/2016   CO2 29 01/31/2016   TSH 2.67 01/31/2016    Mm Outside Films Mammo  Result Date: 04/14/2016 This examination belongs to an outside facility and is stored here for comparison purposes only.  Contact the originating outside institution for any associated report or interpretation.      Assessment & Plan:   Problem List Items Addressed This Visit    Fibrocystic disease of breast    Mammogram 04/14/16 - ok.       GERD (gastroesophageal reflux disease)    Controlled on protonix.        Hypercholesterolemia    Low cholesterol diet and exercise.  Follow lipid panel.        Migraine    Followed by Dr Tomi Likens.  On nortriptyline.  Stable.  Still requires 6-7 imitrex per month.  Has f/u planned next month.            Einar Pheasant, MD

## 2016-05-02 NOTE — Telephone Encounter (Signed)
Pt mentioned that a referral was going to be put in for her for weight loss. Please advise?  Call pt @ [phone]. Thank you!

## 2016-05-02 NOTE — Progress Notes (Signed)
Pre visit review using our clinic review tool, if applicable. No additional management support is needed unless otherwise documented below in the visit note. 

## 2016-05-03 ENCOUNTER — Encounter: Payer: Self-pay | Admitting: Internal Medicine

## 2016-05-03 NOTE — Assessment & Plan Note (Signed)
Mammogram 04/14/16 - ok.

## 2016-05-03 NOTE — Assessment & Plan Note (Signed)
Low cholesterol diet and exercise.  Follow lipid panel.   

## 2016-05-03 NOTE — Assessment & Plan Note (Signed)
Controlled on protonix.   

## 2016-05-03 NOTE — Assessment & Plan Note (Signed)
Followed by Dr Tomi Likens.  On nortriptyline.  Stable.  Still requires 6-7 imitrex per month.  Has f/u planned next month.

## 2016-05-05 NOTE — Telephone Encounter (Signed)
Please advise 

## 2016-05-05 NOTE — Telephone Encounter (Signed)
Order placed for referral.  

## 2016-05-05 NOTE — Telephone Encounter (Signed)
I have placed the order for the referral.  Someone should be contacting her with an appt date and time.  Thanks.   

## 2016-05-06 ENCOUNTER — Telehealth: Payer: Self-pay | Admitting: Internal Medicine

## 2016-05-06 NOTE — Telephone Encounter (Signed)
Pt called requesting refill on naproxen (NAPROSYN) 500 MG tablet and  SUMAtriptan (IMITREX) 100 MG tablet. Please advise, thank you!  Pharmacy - [facility] [place], [place] [address]  Call pt @ [phone]

## 2016-05-07 NOTE — Telephone Encounter (Signed)
lmtrc to office our records show that this was written by her neurologist. Want to check and see if she is still being treated by him. If no will send message to Dr. Nicki Reaper to see if we can get refill.

## 2016-05-08 ENCOUNTER — Ambulatory Visit: Payer: BLUE CROSS/BLUE SHIELD | Admitting: Physical Therapy

## 2016-05-09 MED ORDER — SUMATRIPTAN SUCCINATE 100 MG PO TABS: ORAL_TABLET | ORAL | 1 refills | Status: DC

## 2016-05-09 MED ORDER — NAPROXEN 500 MG PO TABS: ORAL_TABLET | ORAL | 1 refills | Status: DC

## 2016-05-09 NOTE — Telephone Encounter (Signed)
ok'd rx for naprosyn and imitrex.

## 2016-05-09 NOTE — Telephone Encounter (Signed)
Pt informed she will call if any questions.

## 2016-05-09 NOTE — Telephone Encounter (Signed)
Reached patient she was receiving both from neurologist but you had agreed to [facility] for patient. She is out of meds at this time and has a headache.

## 2016-05-19 ENCOUNTER — Ambulatory Visit (INDEPENDENT_AMBULATORY_CARE_PROVIDER_SITE_OTHER): Payer: BLUE CROSS/BLUE SHIELD | Admitting: Neurology

## 2016-05-19 ENCOUNTER — Encounter: Payer: Self-pay | Admitting: Neurology

## 2016-05-19 VITALS — BP 104/62 | HR 97 | Ht 64.0 in | Wt 193.9 lb

## 2016-05-19 DIAGNOSIS — G43709 Chronic migraine without aura, not intractable, without status migrainosus: Secondary | ICD-10-CM

## 2016-05-19 MED ORDER — VENLAFAXINE HCL ER 75 MG PO CP24: 75.0000 mg | ORAL_CAPSULE | Freq: Every day | ORAL | 2 refills | Status: DC

## 2016-05-19 MED ORDER — SUMATRIPTAN SUCCINATE 100 MG PO TABS: ORAL_TABLET | ORAL | 1 refills | Status: DC

## 2016-05-19 MED ORDER — NAPROXEN 500 MG PO TABS: ORAL_TABLET | ORAL | 1 refills | Status: DC

## 2016-05-19 NOTE — Progress Notes (Signed)
NEUROLOGY FOLLOW UP OFFICE NOTE  Natasha Cook [id number]  HISTORY OF PRESENT ILLNESS: Natasha Cook is a 51 year old left-handed woman with histoplasmosis, migraine and history of pericarditis who follows up for migraine.   UPDATE: She was referred to sleep medicine for evaluation of OSA.  She had a sleep study on 01/27/16, which revealed sleep fragmentation and abnormal sleep stage percentages but no sleep apnea or other intrinsic sleep disorder.  Migraines are worse.  She decreased nortriptyline down from 50mg  back to 25mg  daily because the increased dose caused increased depression with some suicidal thoughts.  Intensity:  8-9/10 Duration:  4 hours if abortive therapy effective.  Otherwise 1 day (one time lasted 5 days) Frequency:  20 headache days Current NSAIDS: naproxen 500mg  (with sumatriptan) Current analgesics:  no Current triptans:  sumatriptan 100mg  (with naproxen 500mg ) effective 80% of time. 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  Possible trigger for increased headaches may be use of new disinfectants at work. Sleep hygiene:  Poor.  Snores.  Possibly with apneic episodes   HISTORY:  Onset:  childhood Location:  Usually right sided, from back of head to behind right eye (sometimes left-sided) Quality:  Throbbing headache, stabbing eye pain Initial Intensity:  8-9/10; October: no more than 8/10 Aura:  Blurred  vision in right eye Prodrome:  Hunger for caffeine and sugar Associated symptoms:  Nausea, vomiting, photophobia, phonophobia, osmophobia.  No autonomic symptoms. Initial Duration:  4 hours (if sumatriptan works) up to 1 day.  It lasts 2 to 3 days around her period; October: 2 hours Initial Frequency:  Just prior and after her period.  Otherwise, every other week (total of 7 migraine days per month, but has daily dull headache); October: 6 to 7 days per month. Triggers/exacerbating factors:  Fluorescent light, red wine, scents (perfume), glare Relieving factors:  no Activity:  aggravates   Past NSAIDS:  ibuprofen Past analgesics:  Excedrin, acetaminophen/caffeine/dihydrocodone Past abortive triptans:  Sumatriptan NS (nausea) and injection (effective but inconvenient) Past muscle relaxants:  no Past anti-emetic:  no Past anti-anxiolytic:  no Past antihypertensive medications:  no Past antidepressant medications:  no Past anticonvulsant medications:  topiramate 100mg  (increased dose caused cognitive deficits and blurred vision) Past vitamins/Herbal/Supplements:  no Past antihistamines/decongestants:  no Other past medications:  no   Family history of headache:  yes   OTHER HISTORY: She presented to the ED on 09/06/14/17 with pain and ice cold sensation of entire left arm with pain in the neck.  CBC and BMP were unremarkable.  CT of head was personally reviewed and was unremarkable.  CTA of neck was personally reviewed and was negative for carotid or vertebral artery dissection.  It also revealed disc degeneration and spondylosis at C4-5 with mild spinal stenosis.   Over the past 2 years, she has noticed problems with balance.  She works in a physical therapy class.  When she stands on one foot, she feels off balance.  If she is in the shower and closes her  eyes and turns her head, she needs to hold on.  She denies dizziness but notes fullness in ears.  She denies dizziness.  PAST MEDICAL  HISTORY: Past Medical History:  Diagnosis Date  . Histoplasmosis   . Hypercholesterolemia   . IBS (irritable bowel syndrome)   . Migraine   . Pericarditis     MEDICATIONS: Current Outpatient Prescriptions on File Prior to Visit  Medication Sig Dispense Refill  . Multiple Vitamin (MULTIVITAMIN) capsule Take 1 capsule by mouth daily.    . naproxen (NAPROSYN) 500 MG tablet Take 1 tablet with each dose of sumatriptan 10 tablet 1  . pantoprazole (PROTONIX) 40 MG tablet Take 1 tablet (40 mg total) by mouth daily. 30 tablet 2  . SUMAtriptan (IMITREX) 100 MG tablet Take 1 tab at earliest onset of headache.  May repeat once in 2 hours if headache persists or recurs. 10 tablet 1   No current facility-administered medications on file prior to visit.     ALLERGIES: Allergies  Allergen Reactions  . Codeine Rash  . Penicillins Rash    FAMILY HISTORY: Family History  Problem Relation Age of Onset  . Skin cancer Mother   . Migraines Mother   . Tongue cancer Father   . Stroke Father   . Cancer Maternal Grandmother   . Cancer Maternal Grandfather   . Stroke Paternal Grandmother     SOCIAL HISTORY: Social History   Social History  . Marital status: Married    Spouse name: N/A  . Number of children: 0  . Years of education: PhD   Occupational History  . Not on file.   Social History Main Topics  . Smoking status: Never Smoker  . Smokeless tobacco: Never Used  . Alcohol use Yes  . Drug use: No  . Sexual activity: Not on file   Other Topics Concern  . Not on file   Social History Narrative   Drinks 1-2 cups of coffee a day.     REVIEW OF SYSTEMS: Constitutional: No fevers, chills, or sweats, no generalized fatigue, change in appetite Eyes: No visual changes, double vision, eye pain Ear, nose and throat: No hearing loss, ear pain, nasal congestion, sore throat Cardiovascular: No chest pain, palpitations Respiratory:  No shortness of breath at rest or with exertion,  wheezes GastrointestinaI: No nausea, vomiting, diarrhea, abdominal pain, fecal incontinence Genitourinary:  No dysuria, urinary retention or frequency Musculoskeletal:  No neck pain, back pain Integumentary: No rash, pruritus, skin lesions Neurological: as above Psychiatric: No depression, insomnia, anxiety Endocrine: No palpitations, fatigue, diaphoresis, mood swings, change in appetite, change in weight, increased thirst Hematologic/Lymphatic:  No purpura, petechiae. Allergic/Immunologic: no itchy/runny eyes, nasal congestion, recent allergic reactions, rashes  PHYSICAL EXAM: Vitals:   05/19/16 0938  BP: 104/62  Pulse: 97   General: No acute distress.  Patient appears well-groomed.   Head:  Normocephalic/atraumatic Eyes:  Fundi examined but not visualized Neck: supple, no paraspinal tenderness, full range of motion Heart:  Regular rate and rhythm Lungs:  Clear to auscultation bilaterally Back: No paraspinal tenderness Neurological Exam: alert and oriented to person, place, and time. Attention span and concentration intact, recent and remote memory intact, fund of knowledge intact.  Speech fluent and not dysarthric, language intact.  CN II-XII intact. Bulk and tone normal, muscle strength 5/5 throughout.  Sensation to light touch  intact.  Deep tendon reflexes 2+ throughout.  Finger to nose testing intact.  Gait normal  IMPRESSION: Chronic migraine  PLAN: 1.  Stop  nortriptyline.  Start venlafaxine XR 75mg  daily.  Contact us in 4 weeks with update (or sooner with side effects) 2.  Sumatriptan with naproxen for abortive therapy 3.  Try to avoid/have them switch disinfectant at work 4.  Follow up in 3 months.  Metta Clines, DO  CC: Einar Pheasant, MD

## 2016-05-19 NOTE — Patient Instructions (Signed)
Migraine Recommendations: 1.  Stop nortriptyline.  Start venlafaxine XR 75mg  daily (in the morning with breakfast).  Call in 4 weeks with update and we can adjust dose if needed. 2.  Take sumatriptan with naproxen as needed. 3.  Limit use of pain relievers to no more than 2 days out of the week.  These medications include acetaminophen, ibuprofen, triptans and narcotics.  This will help reduce risk of rebound headaches. 4.  Be aware of common food triggers such as processed sweets, processed foods with nitrites (such as deli meat, hot dogs, sausages), foods with MSG, alcohol (such as wine), chocolate, certain cheeses, certain fruits (dried fruits, some citrus fruit), vinegar, diet soda. 4.  Avoid caffeine 5.  Routine exercise 6.  Proper sleep hygiene 7.  Stay adequately hydrated with water 8.  Keep a headache diary. 9.  Maintain proper stress management. 10.  Do not skip meals. 11.  Consider supplements:  Magnesium citrate 400mg  to 600mg  daily, riboflavin 400mg , Coenzyme Q 10 100mg  three times daily 12.  Follow up in 3 months but contact me in 4 weeks with update.

## 2016-05-22 ENCOUNTER — Encounter: Payer: BLUE CROSS/BLUE SHIELD | Admitting: Physical Therapy

## 2016-06-05 ENCOUNTER — Encounter: Payer: BLUE CROSS/BLUE SHIELD | Admitting: Physical Therapy

## 2016-08-20 ENCOUNTER — Encounter: Payer: Self-pay | Admitting: Neurology

## 2016-08-20 ENCOUNTER — Ambulatory Visit (INDEPENDENT_AMBULATORY_CARE_PROVIDER_SITE_OTHER): Payer: BLUE CROSS/BLUE SHIELD | Admitting: Neurology

## 2016-08-20 VITALS — BP 122/72 | HR 82 | Ht 64.0 in | Wt 194.7 lb

## 2016-08-20 DIAGNOSIS — G43829 Menstrual migraine, not intractable, without status migrainosus: Secondary | ICD-10-CM | POA: Diagnosis not present

## 2016-08-20 DIAGNOSIS — R55 Syncope and collapse: Secondary | ICD-10-CM | POA: Diagnosis not present

## 2016-08-20 MED ORDER — NAPROXEN 500 MG PO TABS: ORAL_TABLET | ORAL | 5 refills | Status: DC

## 2016-08-20 MED ORDER — VENLAFAXINE HCL ER 75 MG PO CP24: 150.0000 mg | ORAL_CAPSULE | Freq: Every day | ORAL | 1 refills | Status: DC

## 2016-08-20 MED ORDER — SUMATRIPTAN SUCCINATE 100 MG PO TABS: ORAL_TABLET | ORAL | 5 refills | Status: DC

## 2016-08-20 NOTE — Patient Instructions (Signed)
1.  Increase venlafaxine XR to 150mg  daily  Contact me prior to refill with update 2.  Continue sumatriptan and naproxen 3.  Follow up in 6 months.

## 2016-08-20 NOTE — Progress Notes (Signed)
NEUROLOGY FOLLOW UP OFFICE NOTE  Natasha Cook [id number]  HISTORY OF PRESENT ILLNESS: Natasha Cook is a 51 year old left-handed woman with histoplasmosis, migraine and history of pericarditis who follows up for migraine.   UPDATE: Intensity:  8/10 Duration:  Within an hour with sumatriptan and naproxen Frequency:  For 3 days beginning 2 days before start of period and then for 3 days after end of period. Current NSAIDS: naproxen 500mg  (with sumatriptan) Current analgesics:  no Current triptans:  sumatriptan 100mg  (with naproxen 500mg ) effective 80% of time. 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  Possible trigger for increased headaches may be use of new disinfectants at work. Sleep hygiene:  Poor.  Snores.  Possibly with apneic episodes  Of note: About 3 weeks ago, she had a possible syncopal event.  She was talking with people and when she turned around and started walking outside, she fell.  She did not lose consciousness but does not remember clearly what happened.  She remembers falling.  She did not trip.  She did not feel lightheaded prior to the fall.  She denied palpitations or dizziness.  Afterwards, she felt funny but was not confused.  It was prior to lunch but she did eat breakfast that morning.  She felt fine after a couple of hours.  It has not recurred.   HISTORY:  Onset:  childhood Location:  Usually right  sided, from back of head to behind right eye (sometimes left-sided) Quality:  Throbbing headache, stabbing eye pain Initial Intensity:  8-9/10; February: 8-9/10 Aura:  Blurred vision in right eye Prodrome:  Hunger for caffeine and sugar Associated symptoms:  Nausea, vomiting, photophobia, phonophobia, osmophobia.  No autonomic symptoms. Initial Duration:  4 hours (if sumatriptan works) up to 1 day.  It lasts 2 to 3 days around her period; February:  4 hours if abortive therapy effective (otherwise all day) Initial Frequency:  Just prior and after her period.  Otherwise, every other week (total of 7 migraine days per month, but has daily dull headache); February: 20 headache days per month. Triggers/exacerbating factors:  Fluorescent light, red wine, scents (perfume), glare Relieving factors:  no Activity:  aggravates   Past NSAIDS:  ibuprofen Past analgesics:  Excedrin, acetaminophen/caffeine/dihydrocodone Past abortive triptans:  Sumatriptan NS (nausea) and injection (effective but inconvenient) Past muscle relaxants:  no Past anti-emetic:  no Past anti-anxiolytic:  no Past antihypertensive medications:  no Past antidepressant medications:  nortriptyline (caused depression and suicidal ideation) Past anticonvulsant medications:  topiramate 100mg  (increased dose caused cognitive deficits and blurred vision) Past vitamins/Herbal/Supplements:  no Past antihistamines/decongestants:  no Other past medications:  no   Family history of headache:  yes   OTHER HISTORY: She presented to the ED on 09/06/14/17 with pain and ice cold sensation of entire left arm with pain in the neck.  CBC and BMP were unremarkable.  CT of head was personally reviewed and was unremarkable.  CTA of neck was personally reviewed and was negative for carotid or  vertebral artery dissection.  It also revealed disc degeneration and spondylosis at C4-5 with mild spinal stenosis.   Over the past 2 years, she has noticed  problems with balance.  She works in a physical therapy class.  When she stands on one foot, she feels off balance.  If she is in the shower and closes her eyes and turns her head, she needs to hold on.  She denies dizziness but notes fullness in ears.  She denies dizziness.  She had a sleep study on 01/27/16, which revealed sleep fragmentation and abnormal sleep stage percentages but no sleep apnea or other intrinsic sleep disorder.  PAST MEDICAL HISTORY: Past Medical History:  Diagnosis Date  . Histoplasmosis   . Hypercholesterolemia   . IBS (irritable bowel syndrome)   . Migraine   . Pericarditis     MEDICATIONS: Current Outpatient Prescriptions on File Prior to Visit  Medication Sig Dispense Refill  . Multiple Vitamin (MULTIVITAMIN) capsule Take 1 capsule by mouth daily.     No current facility-administered medications on file prior to visit.     ALLERGIES: Allergies  Allergen Reactions  . Codeine Rash  . Penicillins Rash    FAMILY HISTORY: Family History  Problem Relation Age of Onset  . Skin cancer Mother   . Migraines Mother   . Tongue cancer Father   . Stroke Father   . Cancer Maternal Grandmother   . Cancer Maternal Grandfather   . Stroke Paternal Grandmother     SOCIAL HISTORY: Social History   Social History  . Marital status: Married    Spouse name: N/A  . Number of children: 0  . Years of education: PhD   Occupational History  . Not on file.   Social History Main Topics  . Smoking status: Never Smoker  . Smokeless tobacco: Never Used  . Alcohol use Yes  . Drug use: No  . Sexual activity: Not on file   Other Topics Concern  . Not on file   Social History Narrative   Drinks 1-2 cups of coffee a day.     REVIEW OF SYSTEMS: Constitutional: No fevers, chills, or sweats, no generalized fatigue, change in appetite Eyes: No visual changes, double vision, eye pain Ear, nose and throat: No hearing loss, ear pain, nasal congestion, sore  throat Cardiovascular: No chest pain, palpitations Respiratory:  No shortness of breath at rest or with exertion, wheezes GastrointestinaI: No nausea, vomiting, diarrhea, abdominal pain, fecal incontinence Genitourinary:  No dysuria, urinary retention or frequency Musculoskeletal:  No neck pain, back pain Integumentary: No rash, pruritus, skin lesions Neurological: as above Psychiatric: No depression, insomnia, anxiety Endocrine: No palpitations, fatigue, diaphoresis, mood swings, change in appetite, change in weight, increased thirst Hematologic/Lymphatic:  No purpura, petechiae. Allergic/Immunologic: no itchy/runny eyes, nasal congestion, recent allergic reactions, rashes  PHYSICAL EXAM: Vitals:   08/20/16 1136  BP: 122/72  Pulse: 82   General: No acute distress.  Patient appears well-groomed.  normal body habitus. Head:  Normocephalic/atraumatic Eyes:  Fundi examined but not visualized Neck: supple, no paraspinal tenderness, full range of motion Heart:  Regular rate and rhythm Lungs:  Clear to auscultation bilaterally Back: No paraspinal tenderness Neurological Exam: alert and oriented to person, place, and time. Attention span and concentration intact, recent and remote memory intact, fund of knowledge intact.  Speech fluent and not dysarthric, language intact.  CN II-XII intact. Bulk and tone normal, muscle strength 5/5 throughout.  Sensation to light touch  intact.  Deep tendon reflexes  2+ throughout, toes downgoing.  Finger to nose testing intact.  Gait normal, Romberg negative.  IMPRESSION: Menstrual migraines Fall, possibly a syncopal event.  It does not sound like seizure.  PLAN: 1.  Will try to improve frequency further by increasing venlafaxine XR to 150mg  daily. 2.  Sumatriptan with naproxen as needed 3.  Monitor for further spells. 4.  Follow up in 6 months.  Metta Clines, DO  CC: Einar Pheasant, MD

## 2016-09-18 ENCOUNTER — Ambulatory Visit (INDEPENDENT_AMBULATORY_CARE_PROVIDER_SITE_OTHER): Payer: BLUE CROSS/BLUE SHIELD | Admitting: Family

## 2016-09-18 ENCOUNTER — Encounter: Payer: Self-pay | Admitting: Family

## 2016-09-18 VITALS — BP 138/90 | HR 84 | Temp 98.6°F | Ht 64.0 in | Wt 193.6 lb

## 2016-09-18 DIAGNOSIS — J029 Acute pharyngitis, unspecified: Secondary | ICD-10-CM | POA: Diagnosis not present

## 2016-09-18 MED ORDER — HYDROCODONE-HOMATROPINE 5-1.5 MG/5ML PO SYRP: 5.0000 mL | ORAL_SOLUTION | Freq: Every evening | ORAL | 0 refills | Status: DC | PRN

## 2016-09-18 MED ORDER — AZITHROMYCIN 250 MG PO TABS: ORAL_TABLET | ORAL | 0 refills | Status: DC

## 2016-09-18 NOTE — Progress Notes (Signed)
Subjective:    Patient ID: Natasha Cook, female    DOB: 10-12-1965, 51 y.o.   MRN: [id number]  CC: Natasha Cook is a 51 y.o. female who presents today for an acute visit.    HPI: CC: sore throat 6 days, worsening Hoarseness, sinus congestion, nasal congestion, 'little dry cough', tactile warmth 6 days ago . Had some wheezing over weekend and since resolved. No SOB.   Tried zicam, dayquiil with no relief  Non smoker  Works at [employer]:  Past Medical History:  Diagnosis Date  . Histoplasmosis   . Hypercholesterolemia   . IBS (irritable bowel syndrome)   . Migraine   . Pericarditis    Past Surgical History:  Procedure Laterality Date  . CHOLECYSTECTOMY    . THORACOTOMY    . THORACOTOMY  1998  . WRIST SURGERY     Family History  Problem Relation Age of Onset  . Skin cancer Mother   . Migraines Mother   . Tongue cancer Father   . Stroke Father   . Cancer Maternal Grandmother   . Cancer Maternal Grandfather   . Stroke Paternal Grandmother     Allergies: Codeine and Penicillins Current Outpatient Prescriptions on File Prior to Visit  Medication Sig Dispense Refill  . Multiple Vitamin (MULTIVITAMIN) capsule Take 1 capsule by mouth daily.    . naproxen (NAPROSYN) 500 MG tablet Take 1 tablet with each dose of sumatriptan 10 tablet 5  . SUMAtriptan (IMITREX) 100 MG tablet Take 1 tab at earliest onset of headache.  May repeat once in 2 hours if headache persists or recurs. 10 tablet 5  . venlafaxine XR (EFFEXOR XR) 75 MG 24 hr capsule Take 2 capsules (150 mg total) by mouth daily. 60 capsule 1   No current facility-administered medications on file prior to visit.     Social History  Substance Use Topics  . Smoking status: Never Smoker  . Smokeless tobacco: Never Used  . Alcohol use Yes    Review of Systems  Constitutional: Negative for chills and fever.  HENT: Positive for congestion and sore throat. Negative for ear pain.   Eyes:  Negative for visual disturbance.  Respiratory: Positive for cough and wheezing. Negative for shortness of breath.   Cardiovascular: Negative for chest pain and palpitations.  Gastrointestinal: Negative for nausea and vomiting.  Neurological: Negative for headaches.      Objective:    BP 138/90   Pulse 84   Temp 98.6 F (37 C) (Oral)   Ht 5\' 4"  (1.626 m)   Wt 193 lb 9.6 oz (87.8 kg)   SpO2 97%   BMI 33.23 kg/m    Physical Exam  Constitutional: She appears well-developed and well-nourished.  HENT:  Head: Normocephalic and atraumatic.  Right Ear: Hearing, tympanic membrane, external ear and ear canal normal. No drainage, swelling or tenderness. No foreign bodies. Tympanic membrane is not erythematous and not bulging. No middle ear effusion. No decreased hearing is noted.  Left Ear: Hearing, tympanic membrane, external ear and ear canal normal. No drainage, swelling or tenderness. No foreign bodies. Tympanic membrane is not erythematous and not bulging.  No middle ear effusion. No decreased hearing is noted.  Nose: Nose normal. No rhinorrhea. Right sinus exhibits no maxillary sinus tenderness and no frontal sinus tenderness. Left sinus exhibits no maxillary sinus tenderness and no frontal sinus tenderness.  Mouth/Throat: Uvula is midline, oropharynx is clear and moist and mucous membranes are normal.  No oropharyngeal exudate, posterior oropharyngeal edema, posterior oropharyngeal erythema or tonsillar abscesses.  Eyes: Conjunctivae are normal.  Cardiovascular: Regular rhythm, normal heart sounds and normal pulses.   Pulmonary/Chest: Effort normal and breath sounds normal. She has no wheezes. She has no rhonchi. She has no rales.  Lymphadenopathy:       Head (right side): No submental, no submandibular, no tonsillar, no preauricular, no posterior auricular and no occipital adenopathy present.       Head (left side): No submental, no submandibular, no tonsillar, no preauricular, no  posterior auricular and no occipital adenopathy present.    She has no cervical adenopathy.  Neurological: She is alert.  Skin: Skin is warm and dry.  Psychiatric: She has a normal mood and affect. Her speech is normal and behavior is normal. Thought content normal.  Vitals reviewed.      Assessment & Plan:   1. Sore throat Afebrile and patient is well-appearing. Based on duration of symptoms,  discussed with patient likely cause is viral etiology for sore throat, cough. 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 Return precautions given  - HYDROcodone-homatropine (HYCODAN) 5-1.5 MG/5ML syrup; Take 5 mLs by mouth at bedtime as needed for cough.  Dispense: 30 mL; Refill: 0 - azithromycin (ZITHROMAX) 250 MG tablet; Tale 500 mg PO on day 1, then 250 mg PO q24h x 4 days.  Dispense: 6 tablet; Refill: 0    I am having [name] start on HYDROcodone-homatropine and azithromycin. I am also having her maintain her multivitamin, venlafaxine XR, SUMAtriptan, and naproxen.   Meds ordered this encounter  Medications  . HYDROcodone-homatropine (HYCODAN) 5-1.5 MG/5ML syrup    Sig: Take 5 mLs by mouth at bedtime as needed for cough.    Dispense:  30 mL    Refill:  0    Order Specific Question:   Supervising Provider    Answer:   Derrel Nip, TERESA L [[id number]]  . azithromycin (ZITHROMAX) 250 MG tablet    Sig: Tale 500 mg PO on day 1, then 250 mg PO q24h x 4 days.    Dispense:  6 tablet    Refill:  0    Order Specific Question:   Supervising Provider    Answer:   Crecencio Mc [[id number]]    Return precautions given.   Risks, benefits, and alternatives of the medications and treatment plan prescribed today were discussed, and patient expressed understanding.   Education regarding symptom management and diagnosis given to patient on AVS.  Continue to follow with Einar Pheasant, MD for routine health maintenance.   Caren Macadam and I agreed with plan.   Mable Paris, FNP

## 2016-09-18 NOTE — Patient Instructions (Signed)
I suspect that your infection is viral in nature.  As discussed, I advise that you wait to fill the antibiotic after 1-2 days of symptom management to see if your symptoms improve. If you do not show improvement, you may take the antibiotic as prescribed.   Salt water gargles  Please take cough medication at night only as needed. As we discussed, I do not recommend dosing throughout the day as coughing is a protective mechanism . It also helps to break up thick mucous.  Do not take cough suppressants with alcohol as can lead to trouble breathing. Advise caution if taking cough suppressant and operating machinery ( i.e driving a car) as you may feel very tired.     If start azithromycin, start probiotic as well  Let me know if not better

## 2016-10-09 ENCOUNTER — Telehealth: Payer: Self-pay | Admitting: *Deleted

## 2016-10-09 NOTE — Telephone Encounter (Signed)
Patient Name: Natasha Cook DOB: 1965-12-11 Initial Comment Caller states bp has been around 136/100, she is also taking Effexor, dr just doubled dose Nurse Assessment Nurse: Neena Rhymes, RN, Sharyn Lull Date/Time (Eastern Time): 10/09/2016 11:42:54 AM Confirm and document reason for call. If symptomatic, describe symptoms. ---Caller states her blood blood pressure has high this am 134/98. She denies chest pain, palpitations, SOB, headache, weakness or numbness on one side of her body. Caller states she has an appointment scheduled with her PCP for Monday. Does the patient have any new or worsening symptoms? ---Yes Will a triage be completed? ---Yes Related visit to physician within the last 2 weeks? ---No Does the PT have any chronic conditions? (i.e. diabetes, asthma, etc.) ---Yes List chronic conditions. ---Migraines on Effexor that was just doubled. Mild mitral vlave prolapse. Hx Hystoplasmosis with 3 episodes pericarditis in 1998. Is the patient pregnant or possibly pregnant? (Ask all females between the ages of 65-55) ---No Is this a behavioral health or substance abuse call? ---No Guidelines Guideline Title Affirmed Question Affirmed Notes High Blood Pressure [2] Systolic BP >= 353 OR Diastolic >= 80 AND [6] history of heart problems, kidney disease or diabetes Final Disposition User See PCP within 2 [name], RN, Sharyn Lull Referrals REFERRED TO PCP OFFICE Disagree/Comply: Leta Baptist

## 2016-10-09 NOTE — Telephone Encounter (Signed)
Patient has already been taken care of please see telephone note.

## 2016-10-09 NOTE — Telephone Encounter (Signed)
FYI  Scheduled patient for Dr. Nicki Reaper @0930  Monday.  Did instruct if patient has Cardiac SX , light headedness, numbness, tingling down extremities and have a BP greater than 140/90 to go to ED. Patient did say she will comply.

## 2016-10-09 NOTE — Telephone Encounter (Signed)
Please give an appt time and date for pt to come in to see Dr Nicki Reaper only, pt would like a follow up to discuss blood presser . Patients report blood pressure averaging 130/90, patient has no symptoms however she takes Effexor and this is a side effect from medication.  Pt contact [phone]  *pt transferred to team health

## 2016-10-09 NOTE — Telephone Encounter (Signed)
Patient also reported a blood pressor reading of 136/100

## 2016-10-13 ENCOUNTER — Ambulatory Visit (INDEPENDENT_AMBULATORY_CARE_PROVIDER_SITE_OTHER): Payer: BLUE CROSS/BLUE SHIELD | Admitting: Internal Medicine

## 2016-10-13 ENCOUNTER — Encounter: Payer: Self-pay | Admitting: Internal Medicine

## 2016-10-13 ENCOUNTER — Ambulatory Visit: Payer: BLUE CROSS/BLUE SHIELD | Admitting: Internal Medicine

## 2016-10-13 ENCOUNTER — Encounter (INDEPENDENT_AMBULATORY_CARE_PROVIDER_SITE_OTHER): Payer: Self-pay

## 2016-10-13 VITALS — BP 126/80 | HR 80 | Temp 98.2°F | Resp 16 | Ht 64.0 in | Wt 192.2 lb

## 2016-10-13 DIAGNOSIS — R0789 Other chest pain: Secondary | ICD-10-CM

## 2016-10-13 DIAGNOSIS — Z9889 Other specified postprocedural states: Secondary | ICD-10-CM

## 2016-10-13 DIAGNOSIS — R079 Chest pain, unspecified: Secondary | ICD-10-CM

## 2016-10-13 DIAGNOSIS — R030 Elevated blood-pressure reading, without diagnosis of hypertension: Secondary | ICD-10-CM

## 2016-10-13 MED ORDER — METOPROLOL TARTRATE 25 MG PO TABS: 25.0000 mg | ORAL_TABLET | Freq: Two times a day (BID) | ORAL | 3 refills | Status: DC

## 2016-10-13 NOTE — Patient Instructions (Signed)
starting you on metoprolol 25 mg twice daily for your  blood pressure  EKG is normal and reassuring  Labs including cardaic enzymes just to be thorough   Return tomorrow for chest x ray

## 2016-10-13 NOTE — Progress Notes (Signed)
Subjective:  Patient ID: Natasha Cook, female    DOB: 04-02-1966  Age: 51 y.o. MRN: [id number]  CC: The primary encounter diagnosis was Chest pain, unspecified type. Diagnoses of History of thoracotomy, Other chest pain, and Elevated blood pressure reading in office without diagnosis of hypertension were also pertinent to this visit.  HPI Natasha Cook presents for follow up on elevated blood pressure .  Home readings have been elevated to 140/90  .  Dose of effexor was doubled by neurology 5 weeks ago for management of chronic recurrent migraines. And the increased dose does appear to be helping to reduce the frequency of her headaches.      Patient also  reports that she has been having recurrent substernal chest pain and left arm numbness  that started 2 days ago.  She has had increased physical activity moving boxes but has a complicated medical history and did not think that hr pain was reproducible with use of her arms.      History of thoracotomy 1998 in [place],  at age 34 for histoplasmosis (the mass created mass effect on heart because it was located  between aortic arch and carina could not be resected) when she presented with dysphagia and strong heart beat.  The treatment (amphoteracin B)  was medical and complicated by pericarditis.  She is s/p cholecystectomy as well   Cardiac risk factors reviewed:  triglycerides 390 in October  no FH of CAD  CT angiogram Jun 2017 during ER visit nor new onset headache and neck pain to rule out carotid dissection noted spondylosis and disk degeneration at C4-5 with mild spinal stenosis Negative sleep study Oct 2017 (Neurology ordered )    EKG normal today  Outpatient Medications Prior to Visit  Medication Sig Dispense Refill  . azithromycin (ZITHROMAX) 250 MG tablet Tale 500 mg PO on day 1, then 250 mg PO q24h x 4 days. 6 tablet 0  . HYDROcodone-homatropine (HYCODAN) 5-1.5 MG/5ML syrup Take 5 mLs by mouth at bedtime as needed for cough.  30 mL 0  . Multiple Vitamin (MULTIVITAMIN) capsule Take 1 capsule by mouth daily.    . naproxen (NAPROSYN) 500 MG tablet Take 1 tablet with each dose of sumatriptan 10 tablet 5  . SUMAtriptan (IMITREX) 100 MG tablet Take 1 tab at earliest onset of headache.  May repeat once in 2 hours if headache persists or recurs. 10 tablet 5  . venlafaxine XR (EFFEXOR XR) 75 MG 24 hr capsule Take 2 capsules (150 mg total) by mouth daily. 60 capsule 1   No facility-administered medications prior to visit.     Review of Systems;  Patient denies headache, fevers, malaise, unintentional weight loss, skin rash, eye pain, sinus congestion and sinus pain, sore throat, dysphagia,  hemoptysis , cough, dyspnea, wheezing,  palpitations, orthopnea, edema, abdominal pain, nausea, melena, diarrhea, constipation, flank pain, dysuria, hematuria, urinary  Frequency, nocturia, numbness, tingling, seizures,  Focal weakness, Loss of consciousness,  Tremor, insomnia, depression, anxiety, and suicidal ideation.      Objective:   BP 126/80 (BP Location: Right Arm, Cuff Size: Large)   Pulse 80   Temp 98.2 F (36.8 C) (Oral)   Resp 16   Ht 5\' 4"  (1.626 m)   Wt 192 lb 3.2 oz (87.2 kg)   SpO2 96%   BMI 32.99 kg/m   BP Readings from Last 3 Encounters:  10/13/16 126/80  09/18/16 138/90  08/20/16 122/72    Wt Readings from Last 3 Encounters:  10/13/16 192 lb 3.2 oz (87.2 kg)  09/18/16 193 lb 9.6 oz (87.8 kg)  08/20/16 194 lb 11.2 oz (88.3 kg)    General appearance: alert, cooperative and appears stated age Ears: normal TM's and external ear canals both ears Throat: lips, mucosa, and tongue normal; teeth and gums normal Neck: no adenopathy, no carotid bruit, supple, symmetrical, trachea midline and thyroid not enlarged, symmetric, no tenderness/mass/nodules Back: symmetric, no curvature. ROM normal. No CVA tenderness. Lungs: clear to auscultation bilaterally Heart: regular rate and rhythm, S1, S2 normal, no murmur,  click, rub or gallop Chest: pain reproduced with forced abduction and adduction of arms  Abdomen: soft, non-tender; bowel sounds normal; no masses,  no organomegaly Pulses: 2+ and symmetric Skin: Skin color, texture, turgor normal. No rashes or lesions Lymph nodes: Cervical, supraclavicular, and axillary nodes normal.  No results found for: HGBA1C  Lab Results  Component Value Date   CREATININE 0.91 01/31/2016   CREATININE 0.88 09/21/2015    Lab Results  Component Value Date   WBC 7.3 02/19/2016   HGB 14.7 02/19/2016   HCT 43.5 02/19/2016   PLT 381.0 02/19/2016   GLUCOSE 101 (H) 01/31/2016   CHOL 234 (H) 01/31/2016   TRIG 390.0 (H) 01/31/2016   HDL 42.20 01/31/2016   LDLDIRECT 129.0 01/31/2016   ALT 20 01/31/2016   AST 9 01/31/2016   NA 138 01/31/2016   K 3.9 01/31/2016   CL 101 01/31/2016   CREATININE 0.91 01/31/2016   BUN 13 01/31/2016   CO2 29 01/31/2016   TSH 2.67 01/31/2016    Mm Outside Films Mammo  Result Date: 04/14/2016 This examination belongs to an outside facility and is stored here for comparison purposes only.  Contact the originating outside institution for any associated report or interpretation.   Assessment & Plan:   Problem List Items Addressed This Visit    History of thoracotomy    1998 in [place], , resulting in diagnosis of histoplasmosis  Unresectable.  Treated with amphoteracin b      Chest pain - Primary    Exam, labs and ekg not concerning for angina.  Chest x ray normal.  M/s exam supports muscle strain       Relevant Orders   EKG 12-Lead (Completed)   DG Chest 2 View (Completed)   CK (Creatine Kinase) (Completed)   CKMB (Completed)   Lipase (Completed)   Monospot (Completed)   Sedimentation rate (Completed)   Troponin I (Completed)   Elevated blood pressure reading in office without diagnosis of hypertension    She has no history of hypertension but has had several elevated readings.  She has been asked to check her pressures at  home and submit readings for evaluation. Renal function will be checked today         I am having Ms. Natasha Cook start on metoprolol tartrate. I am also having her maintain her multivitamin, venlafaxine XR, SUMAtriptan, naproxen, HYDROcodone-homatropine, and azithromycin.  Meds ordered this encounter  Medications  . metoprolol tartrate (LOPRESSOR) 25 MG tablet    Sig: Take 1 tablet (25 mg total) by mouth 2 (two) times daily.    Dispense:  180 tablet    Refill:  3    There are no discontinued medications.  Follow-up: No Follow-up on file.   Crecencio Mc, MD

## 2016-10-14 ENCOUNTER — Ambulatory Visit (INDEPENDENT_AMBULATORY_CARE_PROVIDER_SITE_OTHER): Payer: BLUE CROSS/BLUE SHIELD

## 2016-10-14 DIAGNOSIS — R079 Chest pain, unspecified: Secondary | ICD-10-CM

## 2016-10-14 LAB — MONONUCLEOSIS SCREEN: MONO SCREEN: NEGATIVE

## 2016-10-14 LAB — LIPASE: Lipase: 12 U/L (ref 11.0–59.0)

## 2016-10-14 LAB — CREATININE KINASE MB: CK MB: 1 ng/mL (ref 0.3–4.0)

## 2016-10-14 LAB — CK: Total CK: 30 U/L (ref 7–177)

## 2016-10-14 LAB — SEDIMENTATION RATE: SED RATE: 3 mm/h (ref 0–30)

## 2016-10-15 LAB — TROPONIN I

## 2016-10-15 NOTE — Progress Notes (Signed)
Patient advised of results and verbalized understanding per [facility].

## 2016-10-16 DIAGNOSIS — R079 Chest pain, unspecified: Secondary | ICD-10-CM | POA: Insufficient documentation

## 2016-10-16 DIAGNOSIS — Z9889 Other specified postprocedural states: Secondary | ICD-10-CM | POA: Insufficient documentation

## 2016-10-16 DIAGNOSIS — R030 Elevated blood-pressure reading, without diagnosis of hypertension: Secondary | ICD-10-CM | POA: Insufficient documentation

## 2016-10-16 NOTE — Assessment & Plan Note (Signed)
1998 in [place], , resulting in diagnosis of histoplasmosis  Unresectable.  Treated with amphoteracin b

## 2016-10-16 NOTE — Assessment & Plan Note (Signed)
She has no history of hypertension but has had several elevated readings.  She has been asked to check her pressures at home and submit readings for evaluation. Renal function will be checked today

## 2016-10-16 NOTE — Assessment & Plan Note (Addendum)
Exam, labs and ekg not concerning for angina.  Chest x ray normal.  M/s exam supports muscle strain

## 2016-11-12 ENCOUNTER — Telehealth: Payer: Self-pay | Admitting: Neurology

## 2016-11-12 NOTE — Telephone Encounter (Signed)
Patient called needing to get a refill on her Effexor XR medication. She also needs to speak with you regarding some things that have changed since the dosage was increased. Please Advise. Thanks

## 2016-11-13 MED ORDER — VENLAFAXINE HCL ER 75 MG PO CP24: 150.0000 mg | ORAL_CAPSULE | Freq: Every day | ORAL | 1 refills | Status: DC

## 2016-11-13 NOTE — Addendum Note (Signed)
Addended by: Narda Amber K on: 11/13/2016 03:00 PM   Modules accepted: Orders

## 2016-11-13 NOTE — Telephone Encounter (Addendum)
OK to refill, but may consider reducing the dose in the future if vivid dreams are too bothersome.

## 2016-11-13 NOTE — Telephone Encounter (Signed)
Spoke w/Pt-since Effexor was increased, headaches are much improved, however;  having vivid dreams and increased BP-140/98 (other readings were 130's/90's x 83mo) PCP started her on Metoprolol 25mg  BID, BP back to normal. Pt rqsting refill on Effexor, but wanted to make [place] aware of side effects she's experiencing. OK to refill?

## 2016-11-13 NOTE — Telephone Encounter (Signed)
Called Pt advsd of Rx sent and consider reducing med if needed.

## 2017-01-13 ENCOUNTER — Other Ambulatory Visit: Payer: Self-pay | Admitting: Neurology

## 2017-01-14 ENCOUNTER — Other Ambulatory Visit: Payer: Self-pay | Admitting: *Deleted

## 2017-01-14 MED ORDER — VENLAFAXINE HCL ER 75 MG PO CP24: 150.0000 mg | ORAL_CAPSULE | Freq: Every day | ORAL | 5 refills | Status: DC

## 2017-02-16 ENCOUNTER — Emergency Department
Admission: EM | Admit: 2017-02-16 | Discharge: 2017-02-16 | Disposition: A | Discharge status: Home / Self Care | Payer: BLUE CROSS/BLUE SHIELD | Attending: Emergency Medicine | Admitting: Emergency Medicine

## 2017-02-16 ENCOUNTER — Ambulatory Visit: Payer: Self-pay | Admitting: *Deleted

## 2017-02-16 ENCOUNTER — Emergency Department: Payer: BLUE CROSS/BLUE SHIELD

## 2017-02-16 ENCOUNTER — Other Ambulatory Visit: Payer: Self-pay

## 2017-02-16 DIAGNOSIS — I10 Essential (primary) hypertension: Secondary | ICD-10-CM | POA: Insufficient documentation

## 2017-02-16 DIAGNOSIS — M79671 Pain in right foot: Secondary | ICD-10-CM | POA: Diagnosis present

## 2017-02-16 DIAGNOSIS — R231 Pallor: Secondary | ICD-10-CM | POA: Diagnosis not present

## 2017-02-16 DIAGNOSIS — Z79899 Other long term (current) drug therapy: Secondary | ICD-10-CM | POA: Diagnosis not present

## 2017-02-16 HISTORY — DX: Essential (primary) hypertension: I10

## 2017-02-16 LAB — BASIC METABOLIC PANEL
Anion gap: 8 (ref 5–15)
BUN: 16 mg/dL (ref 6–20)
CALCIUM: 9 mg/dL (ref 8.9–10.3)
CHLORIDE: 105 mmol/L (ref 101–111)
CO2: 26 mmol/L (ref 22–32)
CREATININE: 0.92 mg/dL (ref 0.44–1.00)
GFR calc non Af Amer: >60 mL/min (ref >60)
Glucose, Bld: 91 mg/dL (ref 65–99)
Potassium: 4.3 mmol/L (ref 3.5–5.1)
SODIUM: 139 mmol/L (ref 135–145)

## 2017-02-16 MED ORDER — HYDROCODONE-ACETAMINOPHEN 5-325 MG PO TABS: 1.0000 | ORAL_TABLET | ORAL | 0 refills | Status: DC | PRN

## 2017-02-16 MED ORDER — IOPAMIDOL (ISOVUE-370) INJECTION 76%
125.0000 mL | Freq: Once | INTRAVENOUS | Status: AC | PRN
  Administered 2017-02-16: 125 mL via INTRAVENOUS
  Filled 2017-02-16: qty 125

## 2017-02-16 MED ORDER — KETOROLAC TROMETHAMINE 30 MG/ML IJ SOLN
30.0000 mg | Freq: Once | INTRAMUSCULAR | Status: AC
Start: 2017-02-16 — End: 2017-02-16
  Administered 2017-02-16: 30 mg via INTRAMUSCULAR
  Filled 2017-02-16: qty 1

## 2017-02-16 NOTE — ED Notes (Signed)
First Nurse: pt sent over by PCP for xray of right foot, possible stress fracture.

## 2017-02-16 NOTE — Discharge Instructions (Signed)
Please call and schedule appointments with both podiatry and vascular specialist. Limit weightbearing as much as possible.  Return to the emergency department immediately if you are unable to feel you're foot, or if you can't find a pulse in her foot, or if the skin becomes a bluish color or is paler than the rest of your skin.

## 2017-02-16 NOTE — ED Notes (Signed)
Pt taken to CT via stretcher.

## 2017-02-16 NOTE — ED Provider Notes (Signed)
[facility] Emergency Department Provider Note ____________________________________________  Time seen: Approximately 11:02 AM  I have reviewed the triage vital signs and the nursing notes.   HISTORY  Chief Complaint Foot Pain    HPI Natasha Cook is a 51 y.o. female who presents to the emergency department for evaluation of right foot pain.Pain has increased over the past few days. She's had no specific injury. Occasionally she will have some swelling, but hasn't noticed any bruising or lesions. She states that the pain is on the top of the right foot and increases in severity with weightbearing. She is also noticed a decrease in skin temperature of the right foot up to but not including the ankle. She has had no calf pain. No shortness of breath. No chest pain. No recent travel. She is not on any birth control. She does have a history of migraine. She has no history of DVT. She has taken ibuprofen with out significant relief. Patient works as a Community education officer. Pain can be reproduced somewhat with direct palpation over the dorsal aspect of the foot which sends a paresthesia-like sensation toward the toes.  Past Medical History:  Diagnosis Date  . Histoplasmosis   . Hypercholesterolemia   . Hypertension   . IBS (irritable bowel syndrome)   . Migraine   . Migraine   . Pericarditis     Patient Active Problem List   Diagnosis Date Noted  . History of thoracotomy 10/16/2016  . Chest pain 10/16/2016  . Elevated blood pressure reading in office without diagnosis of hypertension 10/16/2016  . GERD (gastroesophageal reflux disease) 03/09/2016  . Healthcare maintenance 03/09/2016  . Migraine 12/30/2015  . Hypercholesterolemia 12/30/2015  . Fibrocystic disease of breast 12/30/2015  . IBS (irritable bowel syndrome) 12/30/2015  . Spastic bladder 12/30/2015    Past Surgical History:  Procedure Laterality Date  . CHOLECYSTECTOMY    . THORACOTOMY    .  THORACOTOMY  1998  . WRIST SURGERY      Prior to Admission medications   Medication Sig Start Date End Date Taking? Authorizing Provider  azithromycin (ZITHROMAX) 250 MG tablet Tale 500 mg PO on day 1, then 250 mg PO q24h x 4 days. 09/18/16   Burnard Hawthorne, FNP  HYDROcodone-acetaminophen (NORCO/VICODIN) 5-325 MG tablet Take 1 tablet every 4 (four) hours as needed by mouth for moderate pain. 02/16/17 02/16/18  Charly Hunton, Johnette Abraham B, FNP  metoprolol tartrate (LOPRESSOR) 25 MG tablet Take 1 tablet (25 mg total) by mouth 2 (two) times daily. 10/13/16   Crecencio Mc, MD  Multiple Vitamin (MULTIVITAMIN) capsule Take 1 capsule by mouth daily.    [provider]  naproxen (NAPROSYN) 500 MG tablet Take 1 tablet with each dose of sumatriptan 08/20/16   Tomi Likens, Adam R, DO  SUMAtriptan (IMITREX) 100 MG tablet Take 1 tab at earliest onset of headache.  May repeat once in 2 hours if headache persists or recurs. 08/20/16   Pieter Partridge, DO  venlafaxine XR (EFFEXOR XR) 75 MG 24 hr capsule Take 2 capsules (150 mg total) by mouth daily. 01/14/17   Pieter Partridge, DO    Allergies Codeine and Penicillins  Family History  Problem Relation Age of Onset  . Skin cancer Mother   . Migraines Mother   . Tongue cancer Father   . Stroke Father   . Cancer Maternal Grandmother   . Cancer Maternal Grandfather   . Stroke Paternal Grandmother     Social History Social History  Tobacco Use  . Smoking status: Never Smoker  . Smokeless tobacco: Never Used  Substance Use Topics  . Alcohol use: Yes  . Drug use: No    Review of Systems Constitutional: Negative for recent injury or illness. Cardiovascular: Negative for chest pain Respiratory: Negative for shortness of breath Musculoskeletal: Positive for right foot pain Skin: Negative for lesion or wound. Positive for intermittent edema of the right foot.  Neurological: Positive for paresthesias of the dorsal aspect of the right  foot  ____________________________________________   PHYSICAL EXAM:  VITAL SIGNS: ED Triage Vitals [02/16/17 1026]  Enc Vitals Group     BP (!) 110/49     Pulse Rate 70     Resp 18     Temp 98.1 F (36.7 C)     Temp Source Oral     SpO2 98 %     Weight 190 lb (86.2 kg)     Height 5\' 4"  (1.626 m)     Head Circumference      Peak Flow      Pain Score 7     Pain Loc      Pain Edu?      Excl. in [place]?     Constitutional: Alert and oriented. Well appearing and in no acute distress. Eyes: Conjunctivae are clear without discharge or drainage.  Head: Atraumatic Neck: Full, active range of motion observed. Respiratory: Respirations are even and unlabored Musculoskeletal: No tenderness or decreased range of motion of the right ankle, no swelling noted. No tenderness over the plantar aspect of the right foot. PT and dorsalis pedis pulse is easily palpable. Neurologic: Sharp and dull sensation is intact in the right foot  Skin: Right foot is palpably cooler than the left  Psychiatric: Affect and behavior are appropriate.  ____________________________________________   LABS (all labs ordered are listed, but only abnormal results are displayed)  Labs Reviewed  BASIC METABOLIC PANEL   ____________________________________________  RADIOLOGY  Right foot and it is negative for acute bony abnormality was performed while weightbearing. ____________________________________________   PROCEDURES  Procedure(s) performed: None  ____________________________________________   INITIAL IMPRESSION / ASSESSMENT AND PLAN / ED COURSE  Natasha Cook is a 51 y.o. female who presents to the emergency department for evaluation of right foot pain not associated with any injury. Since pain increases with weightbearing, weightbearing image will be obtained.  12:29 PM on 02/16/2017  ABI requested, however ultrasound is unable to complete it today as this is to be a scheduled outpatient  test.  ----------------------------------------- 1:32 PM on 02/16/2017 -----------------------------------------  Dr.Schnier consulted and recommended CT angio aorta with distal runoff to ankle. Discussed with the patient who wants to do this on an outpatient basis. She is now calling her husband to discuss it.   1342 PM on 02/16/2017  Patient decided to stay for the CT, which has been ordered.  ----------------------------------------- 4:28 PM on 02/16/2017 -----------------------------------------  Patient is very anxious to be discharged home and does not wish to stay for manual ABI testing. She states that she will follow-up with specialists who can perform that outpatient. Results of CT were discussed with the patient and her family. She will now follow up with both vascular and podiatry. She is to return to the emergency room for any concerns if she is unable to see primary care, or the specialists right away and strict return precautions were discussed.    Pertinent labs & imaging results that were available during my care of the patient were  reviewed by me and considered in my medical decision making (see chart for details).  _________________________________________   FINAL CLINICAL IMPRESSION(S) / ED DIAGNOSES   Final diagnoses:  Cool skin  Acute pain of right foot   If controlled substance prescribed during this visit, 12 month history viewed on the [place] prior to issuing an initial prescription for Schedule II or III opiod.    Victorino Dike, FNP 02/16/17 1635    Schuyler Amor, MD 02/17/17 [phone]

## 2017-02-16 NOTE — ED Provider Notes (Signed)
-----------------------------------------   3:40 PM on 02/16/2017 -----------------------------------------  I did not personally see pt. She was in ct when I went to see her.  Signed out to dr. Cinda Quest at the end of my shift.    Schuyler Amor, MD 02/16/17 [phone]

## 2017-02-16 NOTE — Telephone Encounter (Signed)
Foot pain that started last week, has gotten progressively worst over the weekend. Using crutches to walk. Care advice given to patient.  Reason for Disposition . [1] SEVERE pain (e.g., excruciating, unable to walk) AND [2] not improved after 2 hours of pain medicine  Answer Assessment - Initial Assessment Questions 1. ONSET: "When did the pain start?"      Last week 2. LOCATION: "Where is the pain located?"      Right foot 3. PAIN: "How bad is the pain?"    (Scale 1-10; or mild, moderate, severe)   -  MILD (1-3): doesn't interfere with normal activities    -  MODERATE (4-7): interferes with normal activities (e.g., work or school) or awakens from sleep, limping    -  SEVERE (8-10): excruciating pain, unable to do any normal activities, unable to walk     9 4. WORK OR EXERCISE: "Has there been any recent work or exercise that involved this part of the body?"      no 5. CAUSE: "What do you think is causing the foot pain?"     Not sure 6. OTHER SYMPTOMS: "Do you have any other symptoms?" (e.g., leg pain, rash, fever, numbness)     no 7. PREGNANCY: "Is there any chance you are pregnant?" "When was your last menstrual period?"     no  Protocols used: ANKLE PAIN-A-AH, FOOT PAIN-A-AH

## 2017-02-16 NOTE — ED Triage Notes (Signed)
Pt c/o right foot pain over last month; no known injury. Pt states pain worse with walking. Pt alert and oriented X4, active, cooperative, pt in NAD. RR even and unlabored, color WNL.

## 2017-02-16 NOTE — ED Notes (Signed)
Pt. Returned to tx. room in stable condition with no acute changes since departure from unit for scans.   

## 2017-02-16 NOTE — ED Notes (Signed)
ED Provider at bedside. 

## 2017-02-16 NOTE — ED Notes (Signed)
Pt has right foot pain.  No known injury.  Increased pain for past few days.  No swelling or deformity noted.  Iv in place   Family with pt.

## 2017-02-16 NOTE — ED Notes (Signed)

## 2017-02-23 ENCOUNTER — Telehealth: Payer: Self-pay

## 2017-02-23 ENCOUNTER — Ambulatory Visit: Payer: BLUE CROSS/BLUE SHIELD | Admitting: Neurology

## 2017-02-23 ENCOUNTER — Encounter: Payer: Self-pay | Admitting: Neurology

## 2017-02-23 VITALS — BP 106/70 | HR 65 | Ht 64.0 in | Wt 204.0 lb

## 2017-02-23 DIAGNOSIS — R296 Repeated falls: Secondary | ICD-10-CM | POA: Diagnosis not present

## 2017-02-23 DIAGNOSIS — G43709 Chronic migraine without aura, not intractable, without status migrainosus: Secondary | ICD-10-CM

## 2017-02-23 MED ORDER — VENLAFAXINE HCL ER 75 MG PO CP24: 75.0000 mg | ORAL_CAPSULE | Freq: Every day | ORAL | 0 refills | Status: DC

## 2017-02-23 MED ORDER — VENLAFAXINE HCL ER 37.5 MG PO CP24: 37.5000 mg | ORAL_CAPSULE | Freq: Every day | ORAL | 0 refills | Status: DC

## 2017-02-23 MED ORDER — PROPRANOLOL HCL ER 60 MG PO CP24: 60.0000 mg | ORAL_CAPSULE | Freq: Every day | ORAL | 3 refills | Status: AC

## 2017-02-23 NOTE — Patient Instructions (Signed)
1.  We will check MRI of brain and sleep deprived EEG 2.  We will taper off of venlafaxine:  Take 75mg  daily for 2 weeks  Then 37.5mg  daily for 2 weeks  Then STOP 3.  I will contact Dr. Nicki Reaper about switching metoprolol to propranolol (which is effective for migraine prevention) 4.  Follow up in 3 months.

## 2017-02-23 NOTE — Telephone Encounter (Signed)
Called Pt, LM on VM advising OK to d/c metoprolol and to start propranolol ER 60mg . I asked for Pt to call me to ensure she receives the message. I advsd was sending Rx to [facility].

## 2017-02-23 NOTE — Addendum Note (Signed)
Addended byTomi Likens, Pinkie Manger R on: 02/23/2017 01:31 PM   Modules accepted: Orders
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
## 2017-02-23 NOTE — Telephone Encounter (Signed)
-----   Message from Pieter Partridge, DO sent at 02/23/2017  1:31 PM EST ----- Please let Glada know that I heard back from Dr. Nicki Reaper.  She is okay with stopping the metoprolol and starting propranolol.  We will start propranolol ER 60mg  daily.  She is advised to spot check her blood pressure and to make follow up appointment with her. She should monitor for lightheadedness.

## 2017-03-04 ENCOUNTER — Ambulatory Visit (INDEPENDENT_AMBULATORY_CARE_PROVIDER_SITE_OTHER): Payer: BLUE CROSS/BLUE SHIELD | Admitting: Neurology

## 2017-03-04 DIAGNOSIS — R296 Repeated falls: Secondary | ICD-10-CM | POA: Diagnosis not present

## 2017-03-05 NOTE — Progress Notes (Signed)
ONE HOUR SLEEP-DEPRIVED ELECTROENCEPHALOGRAM REPORT  Date of Study: 03/04/2017  Patient's Name: Natasha Cook MRN: [id number] Date of Birth: 1965/08/01  Clinical History: 51 year old female with recurrent episodes of unexplained falls without lightheadedness or loss of consciousness.  Medications: LOPRESSOR 25 MG tablet  MULTIVITAMIN capsule   NAPROSYN IMITREX 100 MG tablet  Technical Summary: A multichannel digital EEG recording measured by the international 10-20 system with electrodes applied with paste and impedances below 5000 ohms performed in our laboratory with EKG monitoring in an awake and asleep patient.  Hyperventilation and photic stimulation were performed.  The digital EEG was referentially recorded, reformatted, and digitally filtered in a variety of bipolar and referential montages for optimal display.    Description: The patient is awake and asleep during the recording.  During maximal wakefulness, there is a symmetric, medium voltage 12 Hz posterior dominant rhythm that attenuates with eye opening.  The record is symmetric.  During drowsiness and sleep, there is an increase in theta slowing of the background.  Vertex waves and symmetric sleep spindles were seen.  Hyperventilation and photic stimulation did not elicit any abnormalities.  There were no epileptiform discharges or electrographic seizures seen.  During the study, the patient exhibited brief leg jerks without electrographic correlation.  EKG lead was unremarkable.  Impression: This awake and asleep one-hour sleep-deprived EEG is normal.    Clinical Correlation: A normal EEG does not exclude a clinical diagnosis of epilepsy.  If further clinical questions remain, prolonged EEG may be helpful.  Clinical correlation is advised.   Metta Clines, DO

## 2017-03-11 ENCOUNTER — Telehealth: Payer: Self-pay | Admitting: Neurology

## 2017-03-11 NOTE — Telephone Encounter (Signed)
Called Pt, LM on VM advising that on her last OV, Dr Tomi Likens wanted her to taper and stop the effoxer, also that I had not heard from her about the change from metoprolol to propranolol. I asked her to rtrn my call.

## 2017-03-11 NOTE — Telephone Encounter (Signed)
Pt needs a prescription for Effexor called in

## 2017-03-11 NOTE — Telephone Encounter (Signed)
Pt left a voicemail message about some confusion about a request for medication and would like a call back

## 2017-03-12 NOTE — Telephone Encounter (Signed)
LM for Pt to call me back

## 2017-03-18 ENCOUNTER — Other Ambulatory Visit: Payer: Self-pay

## 2017-03-18 ENCOUNTER — Telehealth: Payer: Self-pay | Admitting: Neurology

## 2017-03-18 MED ORDER — VENLAFAXINE HCL ER 37.5 MG PO CP24: 37.5000 mg | ORAL_CAPSULE | Freq: Every day | ORAL | 0 refills | Status: DC

## 2017-03-18 NOTE — Telephone Encounter (Signed)
Patient needs to talk to someone about medication dosage let message on voice mail

## 2017-03-18 NOTE — Telephone Encounter (Signed)
Called and spoke with Pt. She said her original message she left for me concerning the Effexor, indicated she only needed the 37.5mg . She said she had misplaced the Rx that Dr Tomi Likens gave her. Advsd her the message only stated she needed Effexor called in. Advsd her will send in Rx

## 2017-03-23 ENCOUNTER — Ambulatory Visit (INDEPENDENT_AMBULATORY_CARE_PROVIDER_SITE_OTHER): Payer: BLUE CROSS/BLUE SHIELD | Admitting: Vascular Surgery

## 2017-04-17 ENCOUNTER — Encounter: Payer: Self-pay | Admitting: Internal Medicine

## 2017-04-17 ENCOUNTER — Ambulatory Visit: Payer: BLUE CROSS/BLUE SHIELD | Admitting: Internal Medicine

## 2017-04-17 DIAGNOSIS — R030 Elevated blood-pressure reading, without diagnosis of hypertension: Secondary | ICD-10-CM | POA: Diagnosis not present

## 2017-04-17 DIAGNOSIS — G43909 Migraine, unspecified, not intractable, without status migrainosus: Secondary | ICD-10-CM | POA: Diagnosis not present

## 2017-04-17 DIAGNOSIS — F419 Anxiety disorder, unspecified: Secondary | ICD-10-CM

## 2017-04-17 DIAGNOSIS — R42 Dizziness and giddiness: Secondary | ICD-10-CM | POA: Diagnosis not present

## 2017-04-17 NOTE — Progress Notes (Signed)
Pre visit review using our clinic review tool, if applicable. No additional management support is needed unless otherwise documented below in the visit note. 

## 2017-04-17 NOTE — Patient Instructions (Signed)
[place] [address]

## 2017-04-17 NOTE — Progress Notes (Signed)
Patient ID: Natasha Cook, female   DOB: Apr 30, 1965, 52 y.o.   MRN: [id number]   Subjective:    Patient ID: Natasha Cook, female    DOB: 08-21-1965, 52 y.o.   MRN: [id number]  HPI  Patient here as a work in to discuss increased stress.  Reports increased stress with her job.  Discussed her current work situation.  Discussed at length with her today.  Increased depression and anxiety.  Not sleeping well.  Having night terrors.  H/o headaches - migraines.  Recently saw neurology.  Normal EEG.  Tapered off effexor.  Has been off for one week.  Increased dizziness now.  Started over the last week.  No chest pain.  Breathing stable.  No acid reflux.  No abdominal pain.  Bowels moving.  Now on propranolol.  Blood pressure doing better.  Some suicidal thoughts previously.  Discussed with her today.  States she would not hurt herself.     Past Medical History:  Diagnosis Date  . Histoplasmosis   . Hypercholesterolemia   . Hypertension   . IBS (irritable bowel syndrome)   . Migraine   . Migraine   . Pericarditis    Past Surgical History:  Procedure Laterality Date  . CHOLECYSTECTOMY    . THORACOTOMY    . THORACOTOMY  1998  . WRIST SURGERY     Family History  Problem Relation Age of Onset  . Skin cancer Mother   . Migraines Mother   . Tongue cancer Father   . Stroke Father   . Cancer Maternal Grandmother   . Cancer Maternal Grandfather   . Stroke Paternal Grandmother    Social History   Socioeconomic History  . Marital status: Married    Spouse name: None  . Number of children: 0  . Years of education: PhD  . Highest education level: None  Social Needs  . Financial resource strain: None  . Food insecurity - worry: None  . Food insecurity - inability: None  . Transportation needs - medical: None  . Transportation needs - non-medical: None  Occupational History  . Occupation: professor  Tobacco Use  . Smoking status: Never Smoker  . Smokeless tobacco: Never Used    Substance and Sexual Activity  . Alcohol use: Yes  . Drug use: No  . Sexual activity: None  Other Topics Concern  . None  Social History Narrative   Drinks 1-2 cups of coffee a day.     Outpatient Encounter Medications as of 04/17/2017  Medication Sig  . Multiple Vitamin (MULTIVITAMIN) capsule Take 1 capsule by mouth daily.  . naproxen (NAPROSYN) 500 MG tablet Take 1 tablet with each dose of sumatriptan  . propranolol ER (INDERAL LA) 60 MG 24 hr capsule Take 1 capsule (60 mg total) daily by mouth.  . SUMAtriptan (IMITREX) 100 MG tablet Take 1 tab at earliest onset of headache.  May repeat once in 2 hours if headache persists or recurs.  . [DISCONTINUED] venlafaxine XR (EFFEXOR XR) 37.5 MG 24 hr capsule Take 1 capsule (37.5 mg total) daily with breakfast by mouth.  . [DISCONTINUED] venlafaxine XR (EFFEXOR XR) 37.5 MG 24 hr capsule Take 1 capsule (37.5 mg total) by mouth daily with breakfast.  . [DISCONTINUED] venlafaxine XR (EFFEXOR XR) 75 MG 24 hr capsule Take 1 capsule (75 mg total) daily with breakfast by mouth.   No facility-administered encounter medications on file as of 04/17/2017.     Review of Systems  Constitutional: Negative for appetite change and  unexpected weight change.  HENT: Negative for congestion and sinus pressure.   Respiratory: Negative for cough, chest tightness and shortness of breath.   Cardiovascular: Negative for chest pain and leg swelling.  Gastrointestinal: Negative for abdominal pain, diarrhea, nausea and vomiting.  Genitourinary: Negative for difficulty urinating and dysuria.  Musculoskeletal: Negative for joint swelling and myalgias.  Skin: Negative for color change and rash.  Neurological: Positive for dizziness and headaches.  Psychiatric/Behavioral:       Increased stress and anxiety as outlined.   Previously suicidal thoughts.  None currently.         Objective:    Physical Exam  Constitutional: She appears well-developed and  well-nourished. No distress.  HENT:  Nose: Nose normal.  Mouth/Throat: Oropharynx is clear and moist.  Neck: Neck supple. No thyromegaly present.  Cardiovascular: Normal rate and regular rhythm.  Pulmonary/Chest: Breath sounds normal. No respiratory distress. She has no wheezes.  Abdominal: Soft. Bowel sounds are normal. There is no tenderness.  Musculoskeletal: She exhibits no edema or tenderness.  Lymphadenopathy:    She has no cervical adenopathy.  Skin: No rash noted. No erythema.  Psychiatric: She has a normal mood and affect. Her behavior is normal.    BP 114/70   Pulse 62   Temp 97.9 F (36.6 C) (Oral)   Ht 5\' 4"  (1.626 m)   Wt 205 lb (93 kg)   SpO2 98%   BMI 35.19 kg/m  Wt Readings from Last 3 Encounters:  04/17/17 205 lb (93 kg)  02/23/17 204 lb (92.5 kg)  02/16/17 190 lb (86.2 kg)     Lab Results  Component Value Date   WBC 7.3 02/19/2016   HGB 14.7 02/19/2016   HCT 43.5 02/19/2016   PLT 381.0 02/19/2016   GLUCOSE 91 02/16/2017   CHOL 234 (H) 01/31/2016   TRIG 390.0 (H) 01/31/2016   HDL 42.20 01/31/2016   LDLDIRECT 129.0 01/31/2016   ALT 20 01/31/2016   AST 9 01/31/2016   NA 139 02/16/2017   K 4.3 02/16/2017   CL 105 02/16/2017   CREATININE 0.92 02/16/2017   BUN 16 02/16/2017   CO2 26 02/16/2017   TSH 2.67 01/31/2016    Ct Angio Aortobifemoral W And/or Wo Contrast  Result Date: 02/16/2017 CLINICAL DATA:  Cold right foot with right foot pain. EXAM: CT ANGIOGRAPHY OF ABDOMINAL AORTA WITH ILIOFEMORAL RUNOFF TECHNIQUE: Multidetector CT imaging of the abdomen, pelvis and lower extremities was performed using the standard protocol during bolus administration of intravenous contrast. Multiplanar CT image reconstructions and MIPs were obtained to evaluate the vascular anatomy. CONTRAST:  187mL ISOVUE-370 IOPAMIDOL (ISOVUE-370) INJECTION 76% COMPARISON:  None. FINDINGS: VASCULAR Aorta: Normally patent abdominal aorta. No atherosclerotic plaque, aneurysmal  disease or aortic stenosis. No evidence of aortic dissection or inflammation. Celiac: Widely patent including distal branch vessels. SMA: Normally patent. Renals: Bilateral single renal arteries demonstrate normal patency. IMA: Normally patent. RIGHT Lower Extremity Inflow: Widely patent common, internal and external iliac arteries. Outflow: Widely patent common femoral artery and femoral bifurcation. Runoff: Normally patent SFA, profunda femoral artery, popliteal artery and 3 vessel runoff into the foot with dominant posterior tibial runoff. LEFT Lower Extremity Inflow: Widely patent common, internal and external iliac arteries. Outflow: Widely patent common femoral artery and femoral bifurcation. Runoff: Normally patent SFA, profunda femoral artery, popliteal artery and 3 vessel runoff into the foot with dominant posterior tibial runoff. Veins: No visualized varicosities in either lower extremity. Review of the MIP images confirms the above findings.  NON-VASCULAR Lower chest: No acute abnormality. Hepatobiliary: The liver demonstrates steatosis and several small calcified granulomata. The gallbladder has been removed. No biliary ductal dilatation. Pancreas: Unremarkable. No pancreatic ductal dilatation or surrounding inflammatory changes. Spleen: Focal calcifications within the spleen are consistent with calcified granulomata. The spleen is normal in size. Adrenals/Urinary Tract: Adrenal glands are unremarkable. Kidneys are normal, without renal calculi, focal lesion, or hydronephrosis. Bladder is unremarkable. Stomach/Bowel: Bowel shows no evidence of obstruction or inflammation. No free air. Lymphatic: No enlarged lymph nodes identified. Reproductive: Mild uterine enlargement with lobulated contour is suggestive of underlying leiomyomata. No adnexal masses identified. Other: No free fluid or hernias identified. Musculoskeletal: No acute or significant osseous findings. IMPRESSION: VASCULAR Normal runoff CTA  demonstrating no evidence of atherosclerosis or arterial occlusive disease involving the abdominal aorta and bilateral iliofemoral runoff. NON-VASCULAR Evidence of prior granulomatous disease with calcified granulomata of the liver and spleen. Probable hepatic steatosis. Probable uterine leiomyomata. Electronically Signed   By: Aletta Edouard M.D.   On: 02/16/2017 15:54   Dg Foot Complete Right  Result Date: 02/16/2017 CLINICAL DATA:  Right foot pain.  No known injury. EXAM: RIGHT FOOT COMPLETE - 3+ VIEW COMPARISON:  None. FINDINGS: There is no evidence of fracture or dislocation. There is no evidence of arthropathy or other focal bone abnormality. Soft tissues are unremarkable. IMPRESSION: Negative. Electronically Signed   By: Rolm Baptise M.D.   On: 02/16/2017 12:10       Assessment & Plan:   Problem List Items Addressed This Visit    Anxiety    Increased stress and anxiety as outlined.  Discussed at length with her today.  Increased stress with her work situation.  Some suicidal thoughts.  None now.  Off effexor.  Not sleeping.  Discussed with psychiatry - Dr Nicolasa Ducking.  Agreed to see her tomorrow.  Pt comfortable with this plan.        Dizziness    Dizziness over the last week.  Feel being aggravated by being off effexor.  Discussed with her today.  Hold on further w/up.  Blood pressure ok.  Treat the increased stress.  Get her sleeping better.  See psychiatry as planned.        Elevated blood pressure reading in office without diagnosis of hypertension    Previous elevated blood pressure.  Only on propranolol now.  Controlled.        Migraine    Seeing neurology.  Tapered off effexor.  On propranolol.  Continue f/u with neurology.  Increased stress now aggravating.  Planning to see psychiatry.           I spent 45 minutes with the patient and more than 50% of the time was spent in consultation regarding the above.  Time spent discussing her current increased stress and current  symptoms.  Time also spent discussing plans for further treatment and follow up.     Einar Pheasant, MD

## 2017-04-19 ENCOUNTER — Encounter: Payer: Self-pay | Admitting: Internal Medicine

## 2017-04-19 DIAGNOSIS — F419 Anxiety disorder, unspecified: Secondary | ICD-10-CM | POA: Insufficient documentation

## 2017-04-19 DIAGNOSIS — R42 Dizziness and giddiness: Secondary | ICD-10-CM | POA: Insufficient documentation

## 2017-04-19 NOTE — Assessment & Plan Note (Signed)
Increased stress and anxiety as outlined.  Discussed at length with her today.  Increased stress with her work situation.  Some suicidal thoughts.  None now.  Off effexor.  Not sleeping.  Discussed with psychiatry - Dr Nicolasa Ducking.  Agreed to see her tomorrow.  Pt comfortable with this plan.

## 2017-04-19 NOTE — Assessment & Plan Note (Signed)
Previous elevated blood pressure.  Only on propranolol now.  Controlled.

## 2017-04-19 NOTE — Assessment & Plan Note (Signed)
Dizziness over the last week.  Feel being aggravated by being off effexor.  Discussed with her today.  Hold on further w/up.  Blood pressure ok.  Treat the increased stress.  Get her sleeping better.  See psychiatry as planned.

## 2017-04-19 NOTE — Assessment & Plan Note (Signed)
Seeing neurology.  Tapered off effexor.  On propranolol.  Continue f/u with neurology.  Increased stress now aggravating.  Planning to see psychiatry.

## 2017-07-04 ENCOUNTER — Other Ambulatory Visit: Payer: Self-pay | Admitting: Neurology

## 2017-07-06 ENCOUNTER — Telehealth: Payer: Self-pay | Admitting: Neurology

## 2017-07-06 ENCOUNTER — Other Ambulatory Visit: Payer: Self-pay | Admitting: Neurology

## 2017-07-06 NOTE — Telephone Encounter (Signed)
*  STAT* If patient is at the pharmacy, call can be transferred to refill team.  1.     Which medications need to be refilled? (please list name of each medication and dose if know) Propranolol  2.     Which pharmacy/location (including street and city if local pharmacy) is medication to be sent to?   3.     Do they need a 30 or 90 day supply?   Pt left a message she is completely out CB# [phone] or [phone]

## 2017-07-07 NOTE — Telephone Encounter (Signed)
Patient called back requesting to speak to the clinical staff in regards to her refill request from yesterday for propanolol 60mg . She stated the pharmacy noted that they could not refill the medication because the office had left a note that patient needed to be seen by Dr. Tomi Likens first. Patient was last seen for a 6 mth flup with Dr. Tomi Likens on 02/23/17. She states that at that time she was told she would need to follow up in 1 year. Patient is out of medication and wants the nurse to call her back regarding her refill request. She uses [facility] on [facility]. She can be reached at [phone].

## 2017-07-07 NOTE — Telephone Encounter (Signed)
Pt left a vm message saying she has not received a call back regarding previous messages

## 2017-07-07 NOTE — Telephone Encounter (Signed)
I have called patient back multiple times today with the last call being at 2:53 pm. The phone rings several times until it disconnects my call. Last office note from Dr. Tomi Likens on 02/23/17 states that patient will need to be seen in 3 months. Patient is past due for a follow up appointment and will need to make an appointment before renewal of her medication will be approved. I discussed this process verbally with Garlon Hatchet and Meagen in the clinic and was instructed to have patient make an appointment first and then medication  will be called into pharmacy, but only enough to get her to her follow up appointment.

## 2017-07-08 MED ORDER — PROPRANOLOL HCL ER 60 MG PO CP24: 60.0000 mg | ORAL_CAPSULE | Freq: Every day | ORAL | 2 refills | Status: AC

## 2017-07-08 NOTE — Addendum Note (Signed)
Addended by: Clois Comber on: 07/08/2017 09:16 AM   Modules accepted: Orders

## 2017-07-08 NOTE — Telephone Encounter (Signed)
Called Pt at work, LM on VM indicating there were multiple attempts to reach her at the number she provided for call back. I advised her on 02/23/17 OV, Dr Tomi Likens requested she follow up in 3 months and that was the reason the Rx was denied. I advised her to make an appt, then have the pharmacy send another request and we will be happy to refill the medication until her appt.

## 2017-07-08 NOTE — Telephone Encounter (Signed)
Pt called back, spoke with Luiz Iron, practice administrator.Per Hoyle Sauer, Pt did not want to make an appointment, was adamant she was to f/u in one year vs 3 months. Terence Lux her the AVS she was provided with at the end of her last visit, states 3 months and she will need to make appointment to refill medication. Pt has appointment 07/20/17  I sent in propranolol 60 mg 1 QD, R2 to [facility] as requested. Called Pt at work, call went to voicemail, LM advising Rx refilled.

## 2017-07-20 ENCOUNTER — Ambulatory Visit: Payer: BLUE CROSS/BLUE SHIELD | Admitting: Neurology

## 2017-07-20 ENCOUNTER — Encounter: Payer: Self-pay | Admitting: Neurology

## 2017-07-20 VITALS — BP 90/64 | HR 77 | Ht 64.0 in | Wt 205.4 lb

## 2017-07-20 DIAGNOSIS — G43109 Migraine with aura, not intractable, without status migrainosus: Secondary | ICD-10-CM

## 2017-07-20 DIAGNOSIS — R296 Repeated falls: Secondary | ICD-10-CM | POA: Diagnosis not present

## 2017-07-20 NOTE — Patient Instructions (Addendum)
1.  Continue propranolol ER 60mg  daily  2.  Use sumatriptan with naproxen as needed, limited to no more than 2 days out of week.  3.  We will check MRI of brain without contrast. We have sent a referral to [facility] for your MRI and they will call you directly to schedule your appt. They are located at [place]. If you need to contact them directly please call [phone].  4.  Follow up in 9 months.

## 2017-07-20 NOTE — Progress Notes (Signed)
NEUROLOGY FOLLOW UP OFFICE NOTE  Natasha Cook [id number]  HISTORY OF PRESENT ILLNESS: Natasha Cook is a 52 year old left-handed woman with histoplasmosis, migraine and history of pericarditis who follows up for migraine.   UPDATE: Last visit, venlafaxine was discontinued due to increased blood pressure and vivid dreams.  Her PCP had started her on metoprolol for her blood pressure.  With her consent, we changed metoprolol to propranolol in order to better treat the migraines as well as blood pressure.  Headaches have been well-controlled. Intensity:  8/10 Duration:  Within an hour with sumatriptan and naproxen Frequency:  For 3 days beginning 2 days before start of period and then for 3 days after end of period. Current NSAIDS: naproxen 500mg  (with sumatriptan) Current analgesics:  no Current triptans:  sumatriptan 100mg  (with naproxen 500mg ) effective 80% of time. Current anti-emetic:  no Current muscle relaxants:  no Current anti-anxiolytic:  no Current sleep aide:  no Current Antihypertensive medications:  propranolol ER 60mg  Current Antidepressant medications:  Cymbalta 60mg  Current Anticonvulsant medications:  no Current Vitamins/Herbal/Supplements:  no Current Antihistamines/Decongestants:  no Other therapy:  Dry needling (helps neck pain)   Caffeine:  1 cup coffee daily, 1 diet Coke every other day Alcohol:  occasional Smoker:  no Diet:  hydrates Exercise:  30 minute walk daily Depression/anxiety:  Due to a false accusation at work, she became severely depressed with suicidal ideation.  Since then, she has been cleared and while still present, depression and anxiety is much improved. Sleep hygiene:  Poor.  Snores.  Possibly with apneic episodes   She has continued to have spells where she will fall, unexplained.  Again, no lightheadedness or syncope.  1 hours sleep deprived EEG from 03/04/17 was normal.  An MRI of the brain was ordered but never scheduled.  She has had  one fall since then but had been able to catch herself.   HISTORY:  Onset:  childhood Location:  Usually right sided, from back of head to behind right eye (sometimes left-sided) Quality:  Throbbing headache, stabbing eye pain.  No thunderclap headache. Initial Intensity:  8-9/10; May: 8/10 Aura:  Blurred vision in right eye Prodrome:  Hunger for caffeine and sugar Associated symptoms:  Nausea, vomiting, photophobia, phonophobia, osmophobia.  No autonomic symptoms or unilateral numbness or weakness. Initial Duration:  4 hours (if sumatriptan works) up to 1 day.  It lasts 2 to 3 days around her period; May: Within an hour with sumatriptan and naproxen Initial Frequency:  Just prior and after her period.  Otherwise, every other week (total of 7 migraine days per month, but has daily dull headache); May: For 3 days beginning 2 days before start of period and then for 3 days after end of period. Triggers/exacerbating factors:  Fluorescent light, red wine, scents (perfume), glare Relieving factors:  no Activity:  aggravates   Past NSAIDS:  ibuprofen Past analgesics:  Excedrin, acetaminophen/caffeine/dihydrocodone Past abortive triptans:  Sumatriptan NS (nausea) and injection (effective but inconvenient) Past muscle relaxants:  no Past anti-emetic:  no Past anti-anxiolytic:  no Past antihypertensive medications:  no Past antidepressant medications:  nortriptyline (caused depression and suicidal ideation), venlafaxine XR 150mg  (elevated blood pressure, vivid dreams), sertraline Past anticonvulsant medications:  topiramate 100mg  (increased dose caused cognitive deficits and blurred vision) Past vitamins/Herbal/Supplements:  no Past antihistamines/decongestants:  no Other past medications:  no   Family history of headache:  yes   OTHER HISTORY: She presented to the ED on 09/06/14/17 with pain and ice cold  sensation of entire left arm with pain in the neck.  CBC and BMP were unremarkable.  CT of  head was personally reviewed and was unremarkable.  CTA of neck was personally reviewed and was negative for carotid or vertebral artery dissection.  It also revealed disc degeneration and spondylosis at C4-5 with mild spinal stenosis.   Over the past 2-3 years, she has noticed problems with balance.  She works in a physical therapy class.  When she stands on one foot, she feels off balance.  If she is in the shower and closes her eyes and turns her head, she needs to hold on.  She denies dizziness but notes fullness in ears.  She denied associated dizziness.   In May 2018, she had a possible syncopal event.  She was talking with people and when she turned around and started walking outside, she fell.  She did not lose consciousness but does not remember clearly what happened.  She remembers falling.  She did not trip.  She did not feel lightheaded prior to the fall.  She denied palpitations or dizziness.  Afterwards, she felt funny but was not confused.  It was prior to lunch but she did eat breakfast that morning.  She felt fine after a couple of hours.  It has not recurred.   She had a sleep study on 01/27/16, which revealed sleep fragmentation and abnormal sleep stage percentages but no sleep apnea or other intrinsic sleep disorder.  PAST MEDICAL HISTORY: Past Medical History:  Diagnosis Date  . Histoplasmosis   . Hypercholesterolemia   . Hypertension   . IBS (irritable bowel syndrome)   . Migraine   . Migraine   . Pericarditis     MEDICATIONS: Current Outpatient Medications on File Prior to Visit  Medication Sig Dispense Refill  . DULoxetine (CYMBALTA) 30 MG capsule Take 60 mg by mouth daily.  0  . Multiple Vitamin (MULTIVITAMIN) capsule Take 1 capsule by mouth daily.    . naproxen (NAPROSYN) 500 MG tablet Take 1 tablet with each dose of sumatriptan 10 tablet 5  . propranolol ER (INDERAL LA) 60 MG 24 hr capsule Take 1 capsule (60 mg total) daily by mouth. 30 capsule 3  . propranolol  ER (INDERAL LA) 60 MG 24 hr capsule Take 1 capsule (60 mg total) by mouth daily. 60 capsule 2  . SUMAtriptan (IMITREX) 100 MG tablet Take 1 tab at earliest onset of headache.  May repeat once in 2 hours if headache persists or recurs. 10 tablet 5   No current facility-administered medications on file prior to visit.     ALLERGIES: Allergies  Allergen Reactions  . Codeine Rash  . Penicillins Rash    FAMILY HISTORY: Family History  Problem Relation Age of Onset  . Skin cancer Mother   . Migraines Mother   . Tongue cancer Father   . Stroke Father   . Cancer Maternal Grandmother   . Cancer Maternal Grandfather   . Stroke Paternal Grandmother     SOCIAL HISTORY: Social History   Socioeconomic History  . Marital status: Married    Spouse name: Not on file  . Number of children: 0  . Years of education: PhD  . Highest education level: Not on file  Occupational History  . Occupation: professor  Social Needs  . Financial resource strain: Not on file  . Food insecurity:    Worry: Not on file    Inability: Not on file  . Transportation needs:  Medical: Not on file    Non-medical: Not on file  Tobacco Use  . Smoking status: Never Smoker  . Smokeless tobacco: Never Used  Substance and Sexual Activity  . Alcohol use: Yes  . Drug use: No  . Sexual activity: Not on file  Lifestyle  . Physical activity:    Days per week: Not on file    Minutes per session: Not on file  . Stress: Not on file  Relationships  . Social connections:    Talks on phone: Not on file    Gets together: Not on file    Attends religious service: Not on file    Active member of club or organization: Not on file    Attends meetings of clubs or organizations: Not on file    Relationship status: Not on file  . Intimate partner violence:    Fear of current or ex partner: Not on file    Emotionally abused: Not on file    Physically abused: Not on file    Forced sexual activity: Not on file  Other  Topics Concern  . Not on file  Social History Narrative   Drinks 1-2 cups of coffee a day.     REVIEW OF SYSTEMS: Constitutional: No fevers, chills, or sweats, no generalized fatigue, change in appetite Eyes: No visual changes, double vision, eye pain Ear, nose and throat: No hearing loss, ear pain, nasal congestion, sore throat Cardiovascular: No chest pain, palpitations Respiratory:  No shortness of breath at rest or with exertion, wheezes GastrointestinaI: No nausea, vomiting, diarrhea, abdominal pain, fecal incontinence Genitourinary:  No dysuria, urinary retention or frequency Musculoskeletal:  No neck pain, back pain Integumentary: No rash, pruritus, skin lesions Neurological: as above Psychiatric: depression Endocrine: No palpitations, fatigue, diaphoresis, mood swings, change in appetite, change in weight, increased thirst Hematologic/Lymphatic:  No purpura, petechiae. Allergic/Immunologic: no itchy/runny eyes, nasal congestion, recent allergic reactions, rashes  PHYSICAL EXAM: Vitals:   07/20/17 0900  BP: 90/64  Pulse: 77  SpO2: 98%   General: No acute distress.  Patient appears well-groomed.   Head:  Normocephalic/atraumatic Eyes:  Fundi examined but not visualized Neck: supple, no paraspinal tenderness, full range of motion Heart:  Regular rate and rhythm Lungs:  Clear to auscultation bilaterally Back: No paraspinal tenderness Neurological Exam: alert and oriented to person, place, and time. Attention span and concentration intact, recent and remote memory intact, fund of knowledge intact.  Speech fluent and not dysarthric, language intact.  CN II-XII intact. Bulk and tone normal, muscle strength 5/5 throughout.  Sensation to light touch  intact.  Deep tendon reflexes 2+ throughout.  Finger to nose testing intact.  Gait normal, Romberg negative.  IMPRESSION: Migraine with aura, menstrual, stable Frequent falls  PLAN: 1.  Continue propranolol ER 60mg  daily 2.   Sumatriptan with naproxen as needed, limited to no more than 2 days out of week. 3.  Headache diary 4.  We will check MRI of brain to evaluate unexplained falls. 5.  Follow up in 9 months.  Metta Clines, DO  CC: Einar Pheasant, MD

## 2017-08-03 ENCOUNTER — Other Ambulatory Visit: Payer: Self-pay | Admitting: Neurology

## 2017-08-04 ENCOUNTER — Encounter: Payer: Self-pay | Admitting: Neurology

## 2017-08-12 ENCOUNTER — Ambulatory Visit
Admission: RE | Admit: 2017-08-12 | Discharge: 2017-08-12 | Disposition: A | Discharge status: Home / Self Care | Payer: BLUE CROSS/BLUE SHIELD | Source: Ambulatory Visit | Attending: Neurology | Admitting: Neurology

## 2017-08-12 DIAGNOSIS — R296 Repeated falls: Secondary | ICD-10-CM

## 2017-08-13 ENCOUNTER — Telehealth: Payer: Self-pay

## 2017-08-13 NOTE — Telephone Encounter (Signed)
-----   Message from Pieter Partridge, DO sent at 08/13/2017  2:32 PM EDT ----- MRI is unremarkable.  There is nothing concerning to explain falls

## 2017-08-13 NOTE — Telephone Encounter (Signed)
Called Pt, advsd of MRI results

## 2017-08-19 ENCOUNTER — Ambulatory Visit
Admission: RE | Admit: 2017-08-19 | Discharge: 2017-08-19 | Disposition: A | Discharge status: Home / Self Care | Payer: No Typology Code available for payment source | Source: Ambulatory Visit | Attending: Medical | Admitting: Medical

## 2017-08-19 ENCOUNTER — Ambulatory Visit: Payer: BLUE CROSS/BLUE SHIELD | Admitting: Medical

## 2017-08-19 DIAGNOSIS — M25531 Pain in right wrist: Secondary | ICD-10-CM

## 2017-08-19 NOTE — Progress Notes (Signed)
A WC form was completed.

## 2017-09-16 ENCOUNTER — Other Ambulatory Visit: Payer: Self-pay | Admitting: Orthopaedic Surgery

## 2017-09-16 DIAGNOSIS — S63501A Unspecified sprain of right wrist, initial encounter: Secondary | ICD-10-CM

## 2017-09-28 ENCOUNTER — Ambulatory Visit
Admission: RE | Admit: 2017-09-28 | Discharge: 2017-09-28 | Disposition: A | Discharge status: Home / Self Care | Payer: Worker's Compensation | Source: Ambulatory Visit | Attending: Orthopaedic Surgery | Admitting: Orthopaedic Surgery

## 2017-09-28 DIAGNOSIS — S63501A Unspecified sprain of right wrist, initial encounter: Secondary | ICD-10-CM

## 2017-09-28 MED ORDER — IOPAMIDOL (ISOVUE-M 200) INJECTION 41%
3.0000 mL | Freq: Once | INTRAMUSCULAR | Status: AC
  Administered 2017-09-28: 3 mL via INTRA_ARTICULAR

## 2017-10-29 ENCOUNTER — Other Ambulatory Visit: Payer: Self-pay | Admitting: Neurology

## 2018-01-25 ENCOUNTER — Telehealth: Payer: Self-pay | Admitting: Neurology

## 2018-01-25 NOTE — Telephone Encounter (Signed)
Patient is calling in wanting a refill on her Propranolol. She has a new pharmacy and she needs it to be updated in the system and to see if she can have her medication send there. Please call her back at [phone]. Thanks!

## 2018-01-26 MED ORDER — SUMATRIPTAN SUCCINATE 100 MG PO TABS: 100.0000 mg | ORAL_TABLET | Freq: Once | ORAL | 3 refills | Status: AC | PRN

## 2018-01-26 MED ORDER — PROPRANOLOL HCL ER 60 MG PO CP24: 60.0000 mg | ORAL_CAPSULE | Freq: Every day | ORAL | 3 refills | Status: AC

## 2018-01-26 NOTE — Telephone Encounter (Signed)
Called and spoke with Pt, she has moved to [facility], needs Rx's until her Dr's appts in 59mo

## 2018-02-15 IMAGING — DX DG CHEST 2V
2 series · 2 of 2 positions shown · non-contrast
Comparison: 09/21/2015

CLINICAL DATA: Chest congestion, cough

EXAM:
CHEST  2 VIEW

[chest pa]
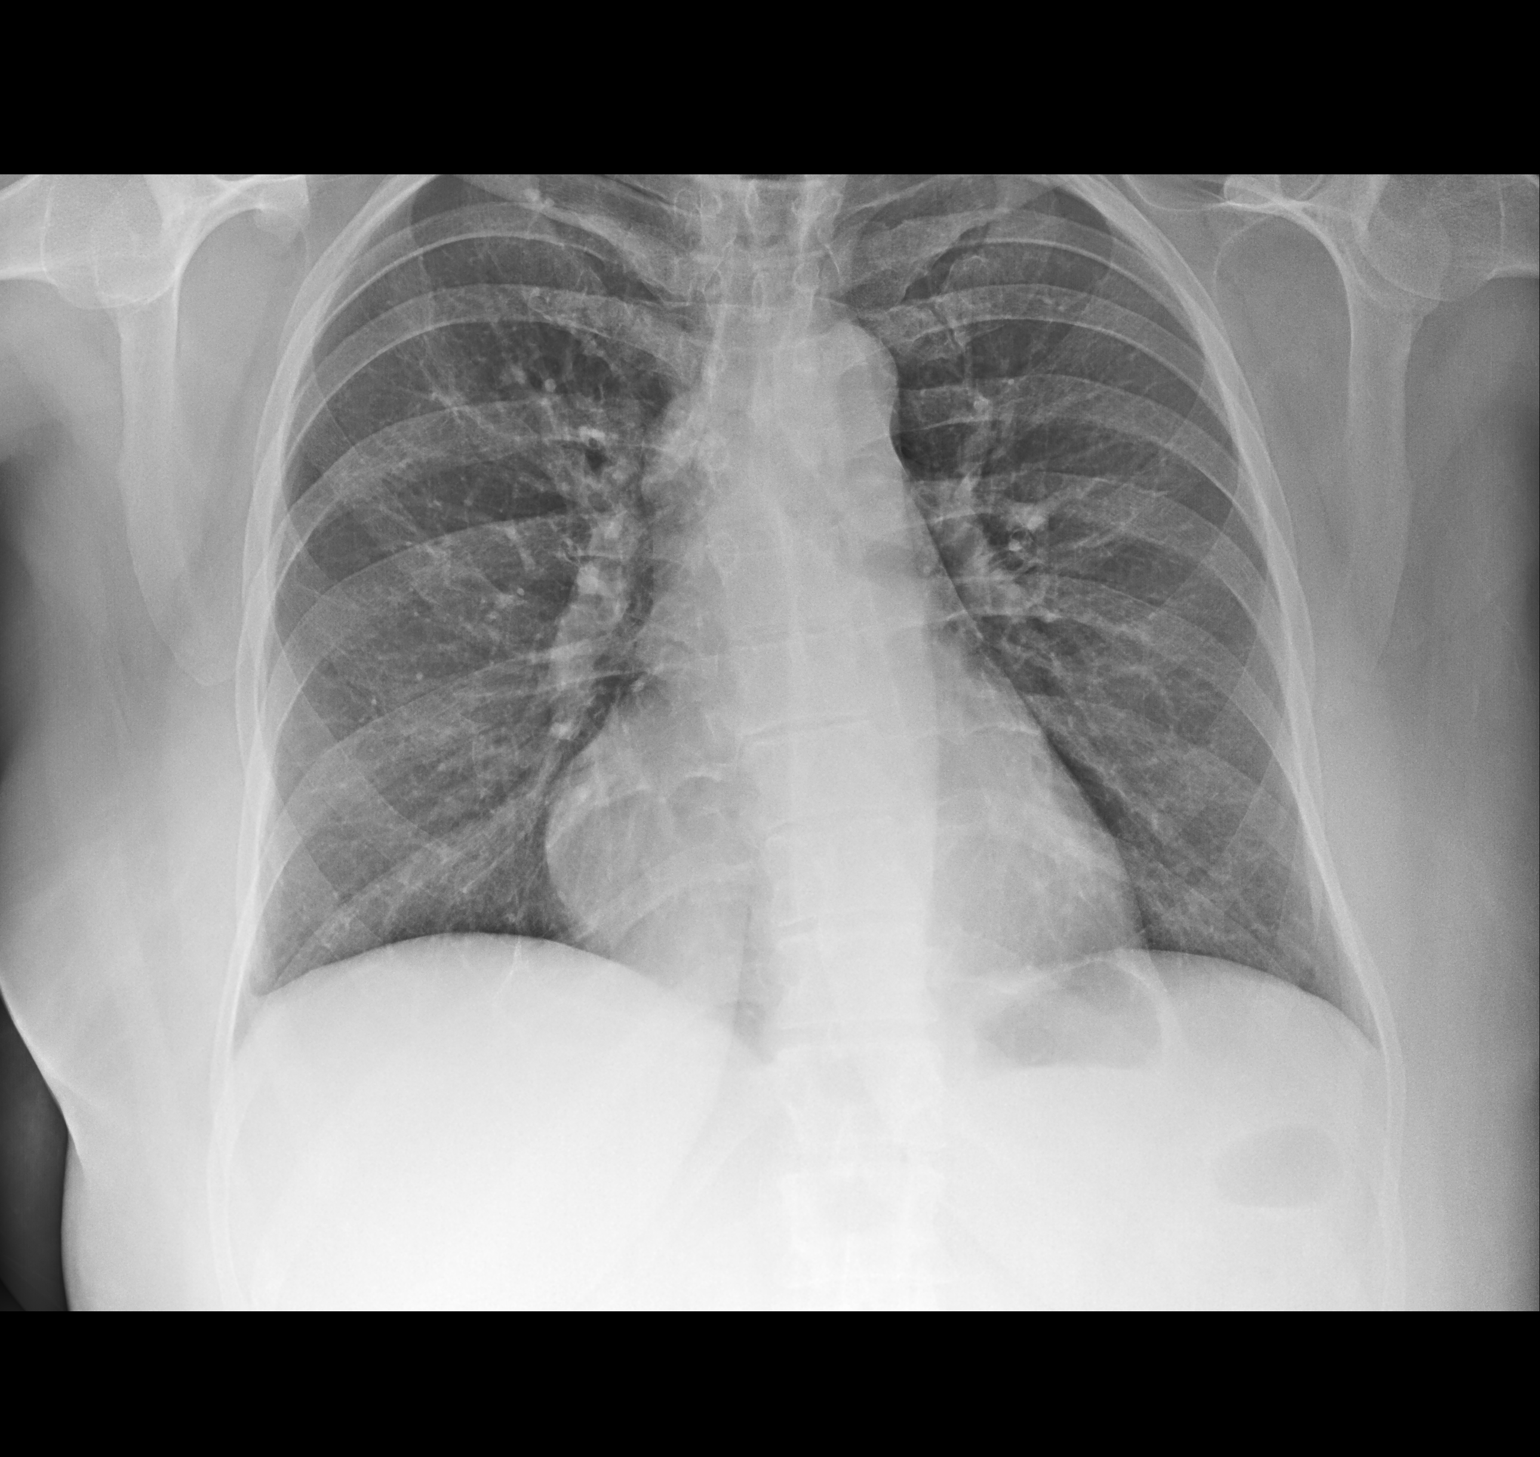

[chest lat]
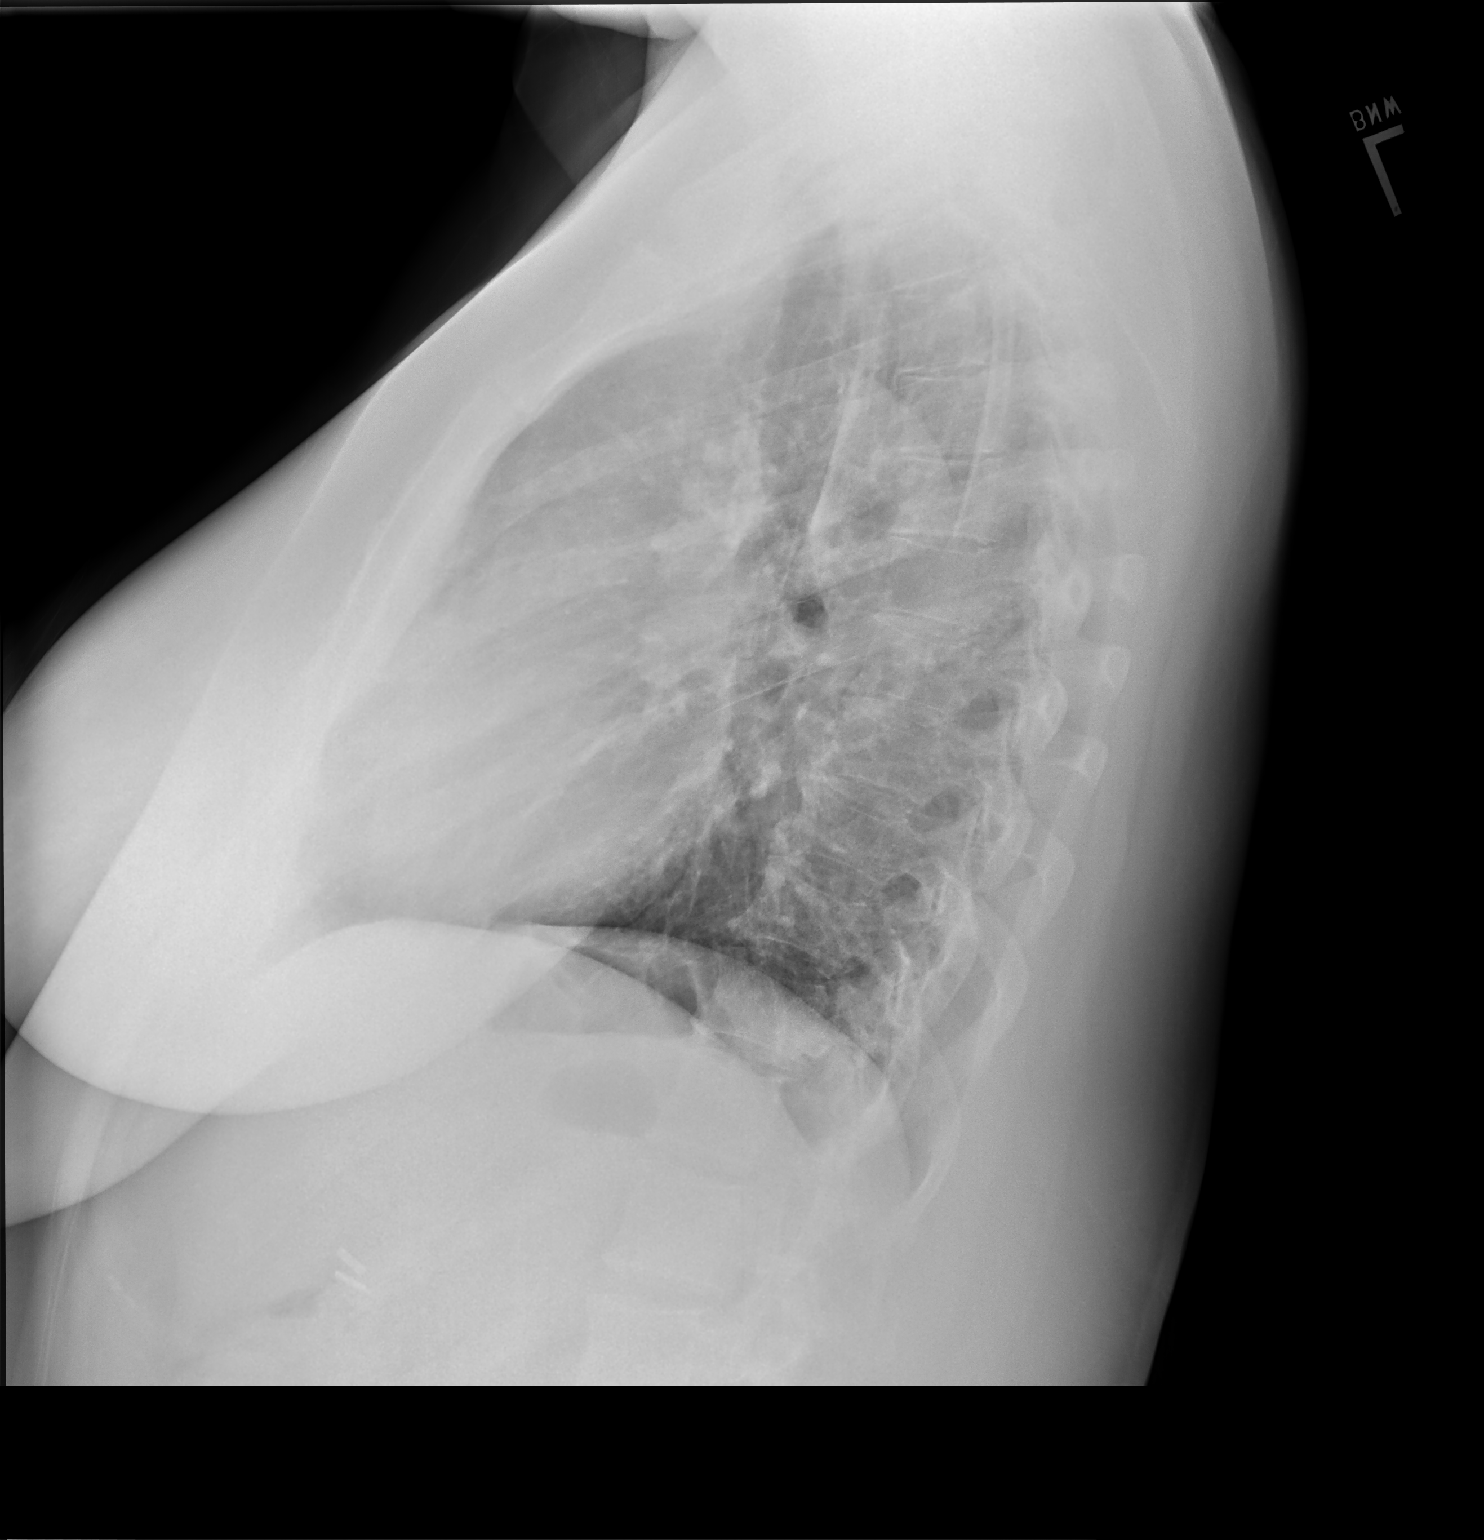

[2 of 2 positions shown; findings below may reference images not displayed]

FINDINGS: The heart size and mediastinal contours are within normal limits.
Both lungs are clear. The visualized skeletal structures are
unremarkable.
IMPRESSION: No active cardiopulmonary disease.

## 2018-04-22 ENCOUNTER — Ambulatory Visit: Payer: BLUE CROSS/BLUE SHIELD | Admitting: Neurology
